# Patient Record
Sex: Female | Born: 1960 | Race: Black or African American | Hispanic: No | Marital: Married | Smoking: Never smoker
Health system: Southern US, Community
[De-identification: ages and names within clinical notes are randomized; demographics above are authoritative.]

## PROBLEM LIST (undated history)

## (undated) DIAGNOSIS — C50919 Malignant neoplasm of unspecified site of unspecified female breast: Secondary | ICD-10-CM

## (undated) DIAGNOSIS — M1990 Unspecified osteoarthritis, unspecified site: Secondary | ICD-10-CM

## (undated) DIAGNOSIS — R7303 Prediabetes: Secondary | ICD-10-CM

## (undated) DIAGNOSIS — I10 Essential (primary) hypertension: Secondary | ICD-10-CM

## (undated) DIAGNOSIS — D573 Sickle-cell trait: Secondary | ICD-10-CM

## (undated) DIAGNOSIS — Z808 Family history of malignant neoplasm of other organs or systems: Secondary | ICD-10-CM

## (undated) DIAGNOSIS — Z806 Family history of leukemia: Secondary | ICD-10-CM

## (undated) DIAGNOSIS — D649 Anemia, unspecified: Secondary | ICD-10-CM

## (undated) DIAGNOSIS — Z803 Family history of malignant neoplasm of breast: Secondary | ICD-10-CM

## (undated) HISTORY — PX: WISDOM TOOTH EXTRACTION: SHX21

## (undated) HISTORY — DX: Family history of malignant neoplasm of other organs or systems: Z80.8

## (undated) HISTORY — PX: DILATION AND CURETTAGE, DIAGNOSTIC / THERAPEUTIC: SUR384

## (undated) HISTORY — DX: Family history of leukemia: Z80.6

## (undated) HISTORY — PX: HERNIA REPAIR: SHX51

## (undated) HISTORY — DX: Family history of malignant neoplasm of breast: Z80.3

## (undated) HISTORY — PX: COLONOSCOPY: SHX174

---

## 2018-04-24 ENCOUNTER — Other Ambulatory Visit: Payer: Self-pay | Admitting: General Surgery

## 2018-04-24 DIAGNOSIS — C50411 Malignant neoplasm of upper-outer quadrant of right female breast: Secondary | ICD-10-CM

## 2018-04-24 DIAGNOSIS — Z170 Estrogen receptor positive status [ER+]: Principal | ICD-10-CM

## 2018-04-24 NOTE — Progress Notes (Signed)
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  Mammogram 03/16/2018: Negative left breast, asymmetry in the upper outer right breast.  Diagnostic imaging confirmed a 9 mm mass at 9:30 on the right breast.  Diagnostic Mammogram 04/02/2018: The area of asymmetric nodular density within the slightly superior lateral aspect of the right breast in an anterior third/retroareolar location persists with compression.  The borders are slightly irregular and otherwise obscured.  Histology per Pathology Report:  Right Breast 04/09/2018   Receptor Status: ER(+), PR (-), Her2-neu (-), Ki-67(40%)   Past/Anticipated interventions by surgeon, if any: Dr. Barry Dienes  - Ordered MM RT radioactive seed loc, sentinel node Inj. - She appears to be a good candidate for breast conservation, although she needs genetic testing prior to surgery.  - If negative, will plan seed localized lumpectomy with SLN bx. - If positive, will refer to plastic surgery to discuss bilateral mastectomy with reconstruction. - Lumpectomy would be followed by radiation, probable oncotype, and antiestrogen therapy. -I have ordered urgent genetics consult.  I am also referring her for oncology and radiation consults in [place] due to her location in [place]. -No conclusive treatment plan.  Past/Anticipated interventions by medical oncology, if any: Chemotherapy  Appointment scheduled with Dr. Lindi Adie 04/28/2018 4 pm. Lymphedema issues, if any:    Pain issues, if any:  No  SAFETY ISSUES:  Prior radiation? No  Pacemaker/ICD? No  Possible current pregnancy? No  Is the patient on methotrexate? No  Current Complaints / other details:  Mother, maternal aunt, maternal first cousin had breast cancer.  -She has sickle  cell trait.      Cori Razor, RN 04/24/2018,12:31 PM

## 2018-04-27 ENCOUNTER — Telehealth: Payer: Self-pay | Admitting: Hematology and Oncology

## 2018-04-27 NOTE — Telephone Encounter (Signed)
On 8/8 I received a new urgent referral from Dr. Barry Dienes for the pt to see med onc and genetics. Pt has been scheduled for genetics on 8/13 at 10am and to see Dr. Lindi Adie at 4pm on the same day. Pt agreed to the appts.

## 2018-04-28 ENCOUNTER — Inpatient Hospital Stay: Payer: BLUE CROSS/BLUE SHIELD

## 2018-04-28 ENCOUNTER — Encounter: Payer: Self-pay | Admitting: Radiation Oncology

## 2018-04-28 ENCOUNTER — Other Ambulatory Visit: Payer: Self-pay | Admitting: General Surgery

## 2018-04-28 ENCOUNTER — Ambulatory Visit
Admission: RE | Admit: 2018-04-28 | Discharge: 2018-04-28 | Disposition: A | Discharge status: Home / Self Care | Payer: BLUE CROSS/BLUE SHIELD | Source: Ambulatory Visit | Attending: Radiation Oncology | Admitting: Radiation Oncology

## 2018-04-28 ENCOUNTER — Encounter: Payer: Self-pay | Admitting: Licensed Clinical Social Worker

## 2018-04-28 ENCOUNTER — Other Ambulatory Visit: Payer: Self-pay

## 2018-04-28 ENCOUNTER — Inpatient Hospital Stay (HOSPITAL_BASED_OUTPATIENT_CLINIC_OR_DEPARTMENT_OTHER): Payer: BLUE CROSS/BLUE SHIELD | Admitting: Licensed Clinical Social Worker

## 2018-04-28 ENCOUNTER — Inpatient Hospital Stay: Payer: BLUE CROSS/BLUE SHIELD | Attending: Hematology and Oncology | Admitting: Hematology and Oncology

## 2018-04-28 VITALS — BP 133/51 | HR 61 | Temp 97.7°F | Resp 20 | Ht 69.0 in | Wt 237.0 lb

## 2018-04-28 DIAGNOSIS — C50411 Malignant neoplasm of upper-outer quadrant of right female breast: Secondary | ICD-10-CM | POA: Insufficient documentation

## 2018-04-28 DIAGNOSIS — Z170 Estrogen receptor positive status [ER+]: Secondary | ICD-10-CM | POA: Diagnosis not present

## 2018-04-28 DIAGNOSIS — Z808 Family history of malignant neoplasm of other organs or systems: Secondary | ICD-10-CM | POA: Insufficient documentation

## 2018-04-28 DIAGNOSIS — Z806 Family history of leukemia: Secondary | ICD-10-CM

## 2018-04-28 DIAGNOSIS — C50911 Malignant neoplasm of unspecified site of right female breast: Secondary | ICD-10-CM | POA: Diagnosis present

## 2018-04-28 DIAGNOSIS — Z803 Family history of malignant neoplasm of breast: Secondary | ICD-10-CM | POA: Diagnosis not present

## 2018-04-28 HISTORY — DX: Sickle-cell trait: D57.3

## 2018-04-28 NOTE — Assessment & Plan Note (Addendum)
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 After reviewing the pathology in detail, we proceeded to discuss the different treatment options between surgery, radiation, chemotherapy, antiestrogen therapies.  Recommendations: Genetic counseling 1. Breast conserving surgery followed by 2. Oncotype DX testing to determine if chemotherapy would be of any benefit followed by 3. Adjuvant radiation therapy followed by 4. Adjuvant antiestrogen therapy  Oncotype counseling: I discussed Oncotype DX test. I explained to the patient that this is a 21 gene panel to evaluate patient tumors DNA to calculate recurrence score. This would help determine whether patient has high risk or intermediate risk or low risk breast cancer. She understands that if her tumor was found to be high risk, she would benefit from systemic chemotherapy. If low risk, no need of chemotherapy. If she was found to be intermediate risk, we would need to evaluate the score as well as other risk factors and determine if an abbreviated chemotherapy may be of benefit.  Return to clinic after surgery to discuss final pathology report and then determine if Oncotype DX testing will need to be sent.

## 2018-04-28 NOTE — Progress Notes (Signed)
REFERRING PROVIDER: [name], MD [address] [place], [place] [address]  PRIMARY REASON FOR VISIT:  1. Malignant neoplasm of upper-outer quadrant of right breast in female, estrogen receptor positive ([place])   2. Family history of breast cancer   3. Family history of leukemia   4. Family history of bone cancer      HISTORY OF PRESENT ILLNESS:   Donna Gould, a 56 y.o. female, was seen for a [place] cancer genetics consultation at the request of Dr. Barry Dienes due to a personal and family history of cancer.  Donna Gould presents to clinic today to discuss the possibility of a hereditary predisposition to cancer, genetic testing, and to further clarify her future cancer risks, as well as potential cancer risks for family members.   In 2019, at the age of 77, Donna Gould was diagnosed with cancer of the right breast (IDC), ER+, PR-, HER-2-. She is waiting on genetics results to help with treatment decisions.   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  First live birth at age: no children. OCP use for approximately 30 years.  Ovaries intact: yes.  Hysterectomy: no.  Menopausal status: perimenopausal.  HRT use: 0 years. Colonoscopy: yes; normal. Mammogram within the last year: yes. Number of breast biopsies: 1.  Past Medical History:  Diagnosis Date  . Family history of bone cancer   . Family history of breast cancer   . Family history of leukemia      Social History   Socioeconomic History  . Marital status: Married    Spouse name: Not on file  . Number of children: Not on file  . Years of education: Not on file  . Highest  education level: Not on file  Occupational History  . Not on file  Social Needs  . Financial resource strain: Not on file  . Food insecurity:    Worry: Not on file    Inability: Not on file  . Transportation needs:    Medical: Not on file    Non-medical: Not on file  Tobacco Use  . Smoking status: Not on file  Substance and Sexual Activity  . Alcohol use: Not on file  . Drug use: Not on file  . Sexual activity: Not on file  Lifestyle  . Physical activity:    Days per week: Not on file    Minutes per session: Not on file  . Stress: Not on file  Relationships  . Social connections:    Talks on phone: Not on file    Gets together: Not on file    Attends religious service: Not on file    Active member of club or organization: Not on file    Attends meetings of clubs or organizations: Not on file    Relationship status: Not on file  Other Topics Concern  . Not on file  Social History Narrative  . Not on file     FAMILY HISTORY:  We obtained a detailed, 4-generation family history.  Significant diagnoses are listed below: Family History  Problem Relation Age of Onset  . Breast cancer Mother  dx 43s  . Bone cancer Father        d. 40  . Breast cancer Maternal Aunt   . Breast cancer Maternal Aunt   . Breast cancer Cousin 45       maternal cousin  . Leukemia Cousin        d. 77s   Donna Gould does not have children. She has three sisters, ages 56, 69, and 40. She has 5 nieces and 1 nephew. No cancer history.  Donna Gould father had bone cancer and died at 81. He had two brothers and three sisters. Donna Gould has 11 paternal cousins, no cancer history. She does not know ages of death or cancer history for her paternal grandparents, but knows they are both deceased.  Donna Gould mother was diagnosed with breast cancer in her 8's. She is living at 66. Donna Gould has 8 maternal aunts and uncles. One of her aunts had breast cancer, unknown age of diagnosis, and is living  in her 46's. Another maternal aunt had breast cancer, unknown age of diagnosis, and passed away in her 23's but not from the breast cancer. Donna Gould also has a maternal cousin, who is the daughter of one of her aunts without a cancer history, that recently had breast cancer at 58 and had a double mastectomy. She does not know if this cousin has had genetic testing. Her cousin is living at 78. Another cousin, the son of her aunt in her 87's, had leukemia and died in his 64s. Donna Gould does not know the ages of death or cancer history for her maternal grandparents, but knows they are both deceased.   Donna Gould is unaware of previous family history of genetic testing for hereditary cancer risks. Patient's maternal ancestors are of African American descent, and paternal ancestors are of African American descent. There is no reported Ashkenazi Jewish ancestry. There is no known consanguinity.  GENETIC COUNSELING ASSESSMENT: Donna Gould is a 57 y.o. female with a personal and family history which is somewhat suggestive of a Hereditary Cancer Predisposition Syndrome. We, therefore, discussed and recommended the following at today's visit.   DISCUSSION: We reviewed the characteristics, features and inheritance patterns of hereditary cancer syndromes. We also discussed genetic testing, including the appropriate family members to test, the process of testing, insurance coverage and turn-around-time for results. We discussed the implications of a negative, positive and/or variant of uncertain significant result. In order to get genetic test results in a timely manner so that Donna Gould can use these genetic test results for surgical decisions, we recommended Donna Gould pursue genetic testing for the [facility] Breast Cancer STAT Panel. If this test is negative, we then recommend Donna Gould pursue reflex genetic testing to the [facility] Common Hereditary Cancers gene panel.   The STAT Breast cancer panel offered by  [facility] includes sequencing and rearrangement analysis for the following 9 genes:  ATM, BRCA1, BRCA2, CDH1, CHEK2, PALB2, PTEN, STK11 and TP53.    The Common Hereditary Cancers Panel offered by [facility] includes sequencing and/or deletion duplication testing of the following 47 genes: APC, ATM, AXIN2, BARD1, BMPR1A, BRCA1, BRCA2, BRIP1, CDH1, CDKN2A (p14ARF), CDKN2A (p16INK4a), CKD4, CHEK2, CTNNA1, DICER1, EPCAM (Deletion/duplication testing only), GREM1 (promoter region deletion/duplication testing only), KIT, MEN1, MLH1, MSH2, MSH3, MSH6, MUTYH, NBN, NF1, NHTL1, PALB2, PDGFRA, PMS2, POLD1, POLE, PTEN, RAD50, RAD51C, RAD51D, SDHB, SDHC, SDHD, SMAD4, SMARCA4. STK11, TP53, TSC1, TSC2, and VHL.  The following genes were evaluated for sequence changes only: SDHA  and HOXB13 c.251G>A variant only.  We discussed that only 5-10% of cancers are associated with a Hereditary cancer predisposition syndrome.  One of the most common hereditary cancer syndromes that increases breast cancer risk is called Hereditary Breast and Ovarian Cancer (HBOC) syndrome.  This syndrome is caused by mutations in the BRCA1 and BRCA2 genes.  This syndrome increases an individual's lifetime risk to develop breast, ovarian, pancreatic, and other types of cancer.  There are also many other cancer predisposition syndromes caused by mutations in several other genes.  We discussed that if she is found to have a mutation in one of these genes, it may impact surgical decisions, and alter future medical management recommendations such as increased cancer screenings and consideration of risk reducing surgeries.  A positive result could also have implications for the patient's family members.  A Negative result would mean we were unable to identify a hereditary component to her cancer, but does not rule out the possibility of a hereditary basis for her cancer.  There could be mutations that are undetectable by current technology, or in genes not yet  tested or identified to increase cancer risk.    We discussed the potential to find a Variant of Uncertain Significance or VUS.  These are variants that have not yet been identified as pathogenic or benign, and it is unknown if this variant is associated with increased cancer risk or if this is a normal finding.  Most VUS's are reclassified to benign or likely benign.   It should not be used to make medical management decisions. With time, we suspect the lab will determine the significance of any VUS's identified if any.   Based on Ms. Korpela's personal and family history of cancer, she meets medical criteria for genetic testing. Despite that she meets criteria, she may still have an out of pocket cost. The lab will notify her of her out of pocket cost.    PLAN: After considering the risks, benefits, and limitations, Donna Gould  provided informed consent to pursue genetic testing and the blood sample was sent to [facility] for analysis of the Breast Cancer STAT Panel, reflex to Common Hereditary Cancers Panel. Results should be available within approximately 5-12 days' time, at which point they will be disclosed by telephone to Donna Gould, as will any additional recommendations warranted by these results. Ms. Morrissey will receive a summary of her genetic counseling visit and a copy of her results once available. This information will also be available in Epic. We encouraged Ms. Monsivais to remain in contact with cancer genetics annually so that we can continuously update the family history and inform her of any changes in cancer genetics and testing that may be of benefit for her family. Ms. Besser questions were answered to her satisfaction today. Our contact information was provided should additional questions or concerns arise.  Based on Ms. Anna's family history, we recommended her maternal cousin who was diagnosed with breast cancer at 1, have genetic counseling and testing. Ms. Mines will let us  know if we can be of any assistance in coordinating genetic counseling and/or testing for this family member.   Lastly, we encouraged Ms. Tuberville to remain in contact with cancer genetics annually so that we can continuously update the family history and inform her of any changes in cancer genetics and testing that may be of benefit for this family.   Ms.  Greeson questions were answered to her satisfaction today. Our contact information was provided  should additional questions or concerns arise. Thank you for the referral and allowing [place] to share in the care of your patient.   Epimenio Foot, MS Genetic Counselor [email or web address] Phone: [phone]  The patient was seen for a total of 40 minutes in face-to-face genetic counseling.  The patient was accompanied today by her husband, Darryl. This patient was discussed with Drs. Magrinat, Lindi Adie and/or Burr Medico who agrees with the above.

## 2018-04-28 NOTE — Progress Notes (Unsigned)
[facility] CONSULT NOTE  Patient Care Team: Patient, No Pcp Per as PCP - General (General Practice)  CHIEF COMPLAINTS/PURPOSE OF CONSULTATION:  Newly diagnosed breast cancer  HISTORY OF PRESENTING ILLNESS:  Donna Gould 57 y.o. female is here because of recent diagnosis of right breast cancer.  Patient had a routine screening mammogram that detected abnormality at 9:30 position measuring 9 mm in size.  Biopsy revealed invasive ductal carcinoma intermediate grade that is ER positive PR negative and HER-2 negative.  Ki-67 was 40%.  She was seen by Dr. Barry Dienes and patient was sent to [place] for discussion regarding treatment options.  She is seeing Dr. Lisbeth Renshaw with radiation oncology as well as genetics today.  I reviewed her records extensively and collaborated the history with the patient.  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 Family history of bone cancer   . Family history of breast cancer   . Family history of leukemia   . Sickle cell trait ([place])     SURGICAL HISTORY: No past surgical history on file.  SOCIAL HISTORY: Social History   Socioeconomic History  . Marital status: Married    Spouse name: Not on file  . Number of children: Not on file  . Years of education: Not on file  . Highest education level: Not on file  Occupational History  . Not on file  Social Needs  . Financial resource strain: Not on file  . Food insecurity:    Worry: Not on file    Inability: Not on file  . Transportation needs:    Medical: Not on file    Non-medical: Not on file  Tobacco Use  . Smoking status: Never  Smoker  . Smokeless tobacco: Never Used  Substance and Sexual Activity  . Alcohol use: Never    Frequency: Never  . Drug use: Never  . Sexual activity: Not on file  Lifestyle  . Physical activity:    Days per week: Not on file    Minutes per session: Not on file  . Stress: Not on file  Relationships  . Social connections:    Talks on phone: Not on file    Gets together: Not on file    Attends religious service: Not on file    Active member of club or organization: Not on file    Attends meetings of clubs or organizations: Not on file    Relationship status: Not on file  . Intimate partner violence:    Fear of current or ex partner: Not on file    Emotionally abused: Not on file    Physically abused: Not on file    Forced sexual activity: Not on file  Other Topics Concern  . Not on file  Social History Narrative   Unable to ask, husband present    FAMILY HISTORY: Family History  Problem Relation Age of Onset  . Breast cancer Mother        dx 93s  . Bone cancer Father        d. 38  . Breast cancer Maternal Aunt   . Breast cancer Maternal  Aunt   . Breast cancer Cousin 74       maternal cousin  . Leukemia Cousin        d. 37s    ALLERGIES:  has no allergies on file.  MEDICATIONS:  Current Outpatient Medications  Medication Sig Dispense Refill  . lisinopril-hydrochlorothiazide (PRINZIDE,ZESTORETIC) 20-25 MG tablet Take 1 tablet by mouth every morning.  3  . LO LOESTRIN FE 1 MG-10 MCG / 10 MCG tablet Take 1 tablet by mouth daily.  12   No current facility-administered medications for this visit.     REVIEW OF SYSTEMS:   Constitutional: Denies fevers, chills or abnormal night sweats Eyes: Denies blurriness of vision, double vision or watery eyes Ears, nose, mouth, throat, and face: Denies mucositis or sore throat Respiratory: Denies cough, dyspnea or wheezes Cardiovascular: Denies palpitation, chest discomfort or lower extremity swelling Gastrointestinal:   Denies nausea, heartburn or change in bowel habits Skin: Denies abnormal skin rashes Lymphatics: Denies new lymphadenopathy or easy bruising Neurological:Denies numbness, tingling or new weaknesses Behavioral/Psych: Mood is stable, no new changes  Breast:  Denies any palpable lumps or discharge All other systems were reviewed with the patient and are negative.  PHYSICAL EXAMINATION: ECOG PERFORMANCE STATUS: 1 - Symptomatic but completely ambulatory  There were no vitals filed for this visit. There were no vitals filed for this visit.  GENERAL:alert, no distress and comfortable SKIN: skin color, texture, turgor are normal, no rashes or significant lesions EYES: normal, conjunctiva are pink and non-injected, sclera clear OROPHARYNX:no exudate, no erythema and lips, buccal mucosa, and tongue normal  NECK: supple, thyroid normal size, non-tender, without nodularity LYMPH:  no palpable lymphadenopathy in the cervical, axillary or inguinal LUNGS: clear to auscultation and percussion with normal breathing effort HEART: regular rate & rhythm and no murmurs and no lower extremity edema ABDOMEN:abdomen soft, non-tender and normal bowel sounds Musculoskeletal:no cyanosis of digits and no clubbing  PSYCH: alert & oriented x 3 with fluent speech NEURO: no focal motor/sensory deficits BREAST: No palpable nodules in breast. No palpable axillary or supraclavicular lymphadenopathy (exam performed in the presence of a chaperone)   RADIOGRAPHIC STUDIES: I have personally reviewed the radiological reports and agreed with the findings in the report.  ASSESSMENT AND PLAN:  Malignant neoplasm of upper-outer quadrant of right breast in female, estrogen receptor positive ([place]) 04/08/2018:Screening detected right breast mass at 9:30 position measuring 9 mm, biopsy revealed IDC intermediate grade, ER 50%, PR negative HER-2 negative, Ki-67 40%, T1 be N0 stage Ia  Pathology and radiology counseling:Discussed  with the patient, the details of pathology including the type of breast cancer,the clinical staging, the significance of ER, PR and HER-2/neu receptors and the implications for treatment. After reviewing the pathology in detail, we proceeded to discuss the different treatment options between surgery, radiation, chemotherapy, antiestrogen therapies.  Recommendations: Genetic counseling 1. Breast conserving surgery followed by 2. Oncotype DX testing to determine if chemotherapy would be of any benefit followed by 3. Adjuvant radiation therapy followed by 4. Adjuvant antiestrogen therapy  Oncotype counseling: I discussed Oncotype DX test. I explained to the patient that this is a 21 gene panel to evaluate patient tumors DNA to calculate recurrence score. This would help determine whether patient has high risk or intermediate risk or low risk breast cancer. She understands that if her tumor was found to be high risk, she would benefit from systemic chemotherapy. If low risk, no need of chemotherapy. If she was found to be intermediate risk, we would  need to evaluate the score as well as other risk factors and determine if an abbreviated chemotherapy may be of benefit.  Return to clinic after surgery to discuss final pathology report and then determine if Oncotype DX testing will need to be sent.   All questions were answered. The patient knows to call the clinic with any problems, questions or concerns.    Harriette Ohara, MD 04/28/18

## 2018-04-29 ENCOUNTER — Encounter: Payer: Self-pay | Admitting: *Deleted

## 2018-04-29 NOTE — Progress Notes (Signed)
Radiation Oncology         [phone] ________________________________  Name: Donna Gould        MRN: [id number]  Date of Service: 04/28/2018 DOB: December 11, 1960  [id number], No Pcp Per  [name], MD     REFERRING PHYSICIAN: [name], MD   DIAGNOSIS: The encounter diagnosis was Malignant neoplasm of upper-outer quadrant of right breast in female, estrogen receptor positive ([place]).   HISTORY OF PRESENT ILLNESS: Donna Gould is a 57 y.o. female seen at the request of Dr. Barry Dienes for a new diagnosis of right breast cancer. The patient was noted to have a screening detected mass in the right breast. 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 She lives in [place] and elects for her medical oncology and surgical oncology care in [place]. She comes today to discuss radiotherapy as a portion of possible treatment for her cancer. She has met this am with genetics and this information from this assessment will determine her surgical decision making, though she has tentative plans for surgery at the end of the month.   PREVIOUS RADIATION THERAPY: No   PAST MEDICAL HISTORY:  Past Medical History:  Diagnosis Date  . Family history of bone cancer   . Family history of breast cancer   . Family history of leukemia   . Sickle cell trait (HCC)        PAST SURGICAL HISTORY:History reviewed. No pertinent surgical history.   FAMILY HISTORY:  Family History  Problem Relation Age of Onset  . Breast cancer Mother        dx 61s  . Bone cancer Father        d. 91  . Breast cancer Maternal Aunt   . Breast cancer Maternal Aunt   . Breast cancer Cousin 72       maternal cousin  . Leukemia Cousin        d. 51s     SOCIAL HISTORY:  reports that she has never smoked. She has never used smokeless tobacco. She reports that she does not drink alcohol or use  drugs.   ALLERGIES: Patient has no allergy information on record.   MEDICATIONS:  Current Outpatient Medications  Medication Sig Dispense Refill  . lisinopril-hydrochlorothiazide (PRINZIDE,ZESTORETIC) 20-25 MG tablet Take 1 tablet by mouth every morning.  3  . LO LOESTRIN FE 1 MG-10 MCG / 10 MCG tablet Take 1 tablet by mouth daily.  12   No current facility-administered medications for this encounter.      REVIEW OF SYSTEMS: On review of systems, the patient reports that she is doing well overall. She denies any chest pain, shortness of breath, cough, fevers, chills, night sweats, unintended weight changes. She denies any bowel or bladder disturbances, and denies abdominal pain, nausea or vomiting. She denies any new musculoskeletal or joint aches or pains. A complete review of systems is obtained and is otherwise negative.     PHYSICAL EXAM:  Wt Readings from Last 3 Encounters:  04/28/18 237 lb (107.5 kg)   Temp Readings from Last 3 Encounters:  04/28/18 97.7 F (36.5 C) (Oral)   BP Readings from Last 3 Encounters:  04/28/18 (!) 133/51   Pulse Readings from Last 3 Encounters:  04/28/18 61     In general this is a well appearing African American female in  no acute distress. She is alert and oriented x4 and appropriate throughout the examination. HEENT reveals that the patient is normocephalic, atraumatic. EOMs are intact. Cardiopulmonary assessment is negative for acute distress and she exhibits normal effort.    ECOG = 0  0 - Asymptomatic (Fully active, able to carry on all predisease activities without restriction)  1 - Symptomatic but completely ambulatory (Restricted in physically strenuous activity but ambulatory and able to carry out work of a light or sedentary nature. For example, light housework, office work)  2 - Symptomatic, <50% in bed during the day (Ambulatory and capable of all self care but unable to carry out any work activities. Up and about more than  50% of waking hours)  3 - Symptomatic, >50% in bed, but not bedbound (Capable of only limited self-care, confined to bed or chair 50% or more of waking hours)  4 - Bedbound (Completely disabled. Cannot carry on any self-care. Totally confined to bed or chair)  5 - Death   Eustace Pen MM, Creech RH, Tormey DC, et al. [phone]). "Toxicity and response criteria of the [facility]". [place] Oncol. 5 (6): 649-55    LABORATORY DATA:  No results found for: WBC, HGB, HCT, MCV, PLT No results found for: NA, K, CL, CO2 No results found for: ALT, AST, GGT, ALKPHOS, BILITOT    RADIOGRAPHY: No results found.     IMPRESSION/PLAN: 1. 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 Dr. Lisbeth Renshaw discusses the pathology findings and reviews the nature of right breast disease. She is awaiting genetic results to make surgical decisions. Dr. Lisbeth Renshaw discusses the rationale for adjuvant radiotherapy following breast conservation therapy. With the data we have today, if she did undergo mastectomy, she would not likely benefit from radiotherapy, however we did discuss that final pathology assessment would determine this if she has mastectomy. Depending on the size of the final tumor measurements rendered by pathology, the tumor may be tested for Oncotype Dx score to determine a role for systemic therapy. Provided that chemotherapy is not indicated, the patient's course would then be followed by external radiotherapy to the breast followed by antiestrogen therapy. We discussed the risks, benefits, short, and long term effects of radiotherapy. Dr. Lisbeth Renshaw discusses the delivery and logistics of radiotherapy and anticipates a course of 6 1/2 weeks of radiotherapy if she has conservation surgery. We will follow up with her decision making. She may also elect to have radiotherapy closer to home in [place] or in [place], [place].  In a visit lasting 60 minutes, greater than 50%  of the time was spent face to face discussing her case, and coordinating the patient's care.   The above documentation reflects my direct findings during this shared patient visit. Please see the separate note by Dr. Lisbeth Renshaw on this date for the remainder of the patient's plan of care.    Carola Rhine, PAC

## 2018-04-30 ENCOUNTER — Telehealth: Payer: Self-pay | Admitting: Hematology and Oncology

## 2018-04-30 NOTE — Telephone Encounter (Signed)
Per 8/14 sch msg - make f/u appt for 9/4.Marland Kitchen  Called patient and left message regarding 9/4 appt @ 10:00 am.  Mailed calendar.

## 2018-05-06 ENCOUNTER — Other Ambulatory Visit: Payer: Self-pay

## 2018-05-06 ENCOUNTER — Encounter: Payer: Self-pay | Admitting: Licensed Clinical Social Worker

## 2018-05-06 ENCOUNTER — Ambulatory Visit: Payer: Self-pay | Admitting: Licensed Clinical Social Worker

## 2018-05-06 ENCOUNTER — Encounter (HOSPITAL_BASED_OUTPATIENT_CLINIC_OR_DEPARTMENT_OTHER): Payer: Self-pay | Admitting: *Deleted

## 2018-05-06 ENCOUNTER — Telehealth: Payer: Self-pay | Admitting: Licensed Clinical Social Worker

## 2018-05-06 DIAGNOSIS — C50411 Malignant neoplasm of upper-outer quadrant of right female breast: Secondary | ICD-10-CM

## 2018-05-06 DIAGNOSIS — Z806 Family history of leukemia: Secondary | ICD-10-CM

## 2018-05-06 DIAGNOSIS — Z1379 Encounter for other screening for genetic and chromosomal anomalies: Secondary | ICD-10-CM

## 2018-05-06 DIAGNOSIS — Z803 Family history of malignant neoplasm of breast: Secondary | ICD-10-CM

## 2018-05-06 DIAGNOSIS — Z170 Estrogen receptor positive status [ER+]: Secondary | ICD-10-CM

## 2018-05-06 DIAGNOSIS — Z808 Family history of malignant neoplasm of other organs or systems: Secondary | ICD-10-CM

## 2018-05-06 NOTE — Telephone Encounter (Signed)
Revealed negative genetic testing.     This normal result is reassuring. We discussed that we do not know why she has breast cancer or why there is cancer in the family. It could be due to a different gene that we are not testing, or something our current technology cannot pick up.  It will be important for her to keep in contact with genetics to learn if additional testing may be needed in the future.

## 2018-05-06 NOTE — Progress Notes (Signed)
HPI:  Donna Gould was previously seen in the [place] clinic on 04/28/2018 due to a personal and family history of breast cancer and concerns regarding a hereditary predisposition to cancer. Please refer to our prior cancer genetics clinic note for more information regarding Donna Gould's medical, social and family histories, and our assessment and recommendations, at the time. Donna Gould recent genetic test results were disclosed to her, as well as recommendations warranted by these results. These results and recommendations are discussed in more detail below.  CANCER HISTORY:    Malignant neoplasm of upper-outer quadrant of right breast in female, estrogen receptor positive ([place])   04/08/2018 Initial Diagnosis    Screening detected right breast mass at 9:30 position measuring 9 mm, biopsy revealed IDC intermediate grade, ER 50%, PR negative HER-2 negative, Ki-67 40%, T1 be N0 stage Ia     Genetic Testing    Negative genetic testing on the [facility] Breast Cancer STAT + Common Hereditary Cancers Panel. The STAT Breast cancer panel offered by [facility] includes sequencing and rearrangement analysis for the following 9 genes:  ATM, BRCA1, BRCA2, CDH1, CHEK2, PALB2, PTEN, STK11 and TP53.  The Common Hereditary Cancers Panel offered by [facility] includes sequencing and/or deletion duplication testing of the following 47 genes: APC, ATM, AXIN2, BARD1, BMPR1A, BRCA1, BRCA2, BRIP1, CDH1, CDKN2A (p14ARF), CDKN2A (p16INK4a), CKD4, CHEK2, CTNNA1, DICER1, EPCAM (Deletion/duplication testing only), GREM1 (promoter region deletion/duplication testing only), KIT, MEN1, MLH1, MSH2, MSH3, MSH6, MUTYH, NBN, NF1, NHTL1, PALB2, PDGFRA, PMS2, POLD1, POLE, PTEN, RAD50, RAD51C, RAD51D, SDHB, SDHC, SDHD, SMAD4, SMARCA4. STK11, TP53, TSC1, TSC2, and VHL.  The following genes were evaluated for sequence changes only: SDHA and HOXB13 c.251G>A variant only.  The report date is 05/06/18      FAMILY HISTORY:  We obtained  a detailed, 4-generation family history.  Significant diagnoses are listed below: Family History  Problem Relation Age of Onset  . Breast cancer Mother        dx 80s  . Bone cancer Father        d. 77  . Breast cancer Maternal Aunt   . Breast cancer Maternal Aunt   . Breast cancer Cousin 37       maternal cousin  . Leukemia Cousin        d. 74s   Donna Gould does not have children. She has three sisters, ages 51, 73, and 41. She has 5 nieces and 1 nephew. No cancer history.  Donna Gould father had bone cancer and died at 14. He had two brothers and three sisters. Donna Gould has 11 paternal cousins, no cancer history. She does not know ages of death or cancer history for her paternal grandparents, but knows they are both deceased.  Donna Gould mother was diagnosed with breast cancer in her 54's. She is living at 50. Donna Gould has 8 maternal aunts and uncles. One of her aunts had breast cancer, unknown age of diagnosis, and is living in her 96's. Another maternal aunt had breast cancer, unknown age of diagnosis, and passed away in her 49's but not from the breast cancer. Donna Gould also has a maternal cousin, who is the daughter of one of her aunts without a cancer history, that recently had breast cancer at 75 and had a double mastectomy. She does not know if this cousin has had genetic testing. Her cousin is living at 22. Another cousin, the son of her aunt in her 40's, had leukemia and died in his  72s. Donna Gould does not know the ages of death or cancer history for her maternal grandparents, but knows they are both deceased.   Donna Gould is unaware of previous family history of genetic testing for hereditary cancer risks. Patient's maternal ancestors are of African American descent, and paternal ancestors are of African American descent. There is no reported Ashkenazi Jewish ancestry. There is no known consanguinity.   GENETIC TEST RESULTS: Genetic testing performed through [facility]'s Breast  Cancer STAT + Common Hereditary Cancers Panel reported out on 05/06/2018 showed no pathogenic mutations. The STAT Breast cancer panel offered by [facility] includes sequencing and rearrangement analysis for the following 9 genes:  ATM, BRCA1, BRCA2, CDH1, CHEK2, PALB2, PTEN, STK11 and TP53.  The Common Hereditary Cancers Panel offered by [facility] includes sequencing and/or deletion duplication testing of the following 47 genes: APC, ATM, AXIN2, BARD1, BMPR1A, BRCA1, BRCA2, BRIP1, CDH1, CDKN2A (p14ARF), CDKN2A (p16INK4a), CKD4, CHEK2, CTNNA1, DICER1, EPCAM (Deletion/duplication testing only), GREM1 (promoter region deletion/duplication testing only), KIT, MEN1, MLH1, MSH2, MSH3, MSH6, MUTYH, NBN, NF1, NHTL1, PALB2, PDGFRA, PMS2, POLD1, POLE, PTEN, RAD50, RAD51C, RAD51D, SDHB, SDHC, SDHD, SMAD4, SMARCA4. STK11, TP53, TSC1, TSC2, and VHL.  The following genes were evaluated for sequence changes only: SDHA and HOXB13 c.251G>A variant only.  The test report will be scanned into EPIC and will be located under the Molecular Pathology section of the Results Review tab. A portion of the result report is included below for reference.     We discussed with Donna Gould that because current genetic testing is not perfect, it is possible there may be a gene mutation in one of these genes that current testing cannot detect, but that chance is small.  We also discussed, that there could be another gene that has not yet been discovered, or that we have not yet tested, that is responsible for the cancer diagnoses in the family. It is also possible there is a hereditary cause for the cancer in the family that Donna Gould did not inherit and therefore was not identified in her testing.  Therefore, it is important to remain in touch with cancer genetics in the future so that we can continue to offer Donna Gould the most up to date genetic testing.   CANCER SCREENING RECOMMENDATIONS: Donna Gould test result is considered negative (normal).   This means that we have not identified a hereditary cause for her personal and family history of cancer at this time.   While reassuring, this does not definitively rule out a hereditary predisposition to cancer. It is still possible that there could be genetic mutations that are undetectable by current technology, or genetic mutations in genes that have not been tested or identified to increase cancer risk.  Therefore, it is recommended she continue to follow the cancer management and screening guidelines provided by her oncology and primary healthcare provider. An individual's cancer risk is not determined by genetic test results alone.  Overall cancer risk assessment includes additional factors Gould as personal medical history, family history, etc.  These should be used to make a personalized plan for cancer prevention and surveillance.    RECOMMENDATIONS FOR FAMILY MEMBERS:  Relatives in this family might be at some increased risk of developing cancer, over the general population risk, simply due to the family history of cancer.  We recommended women in this family have a yearly mammogram beginning at age 5, or 67 years younger than the earliest onset of cancer, an annual clinical breast exam, and perform  monthly breast self-exams. Women in this family should also have a gynecological exam as recommended by their primary provider. All family members should have a colonoscopy by age 77 (or as directed by their doctors).  All family members should inform their physicians about the family history of cancer so their doctors can make the most appropriate screening recommendations for them.   It is also possible there is a hereditary cause for the cancer in Ms. Patin's family that she did not inherit and therefore was not identified in her.   We recommended her maternal cousin diagnosed with breast cancer at 35 have genetic counseling and testing. Donna Gould will let us know if we can be of any assistance in  coordinating genetic counseling and/or testing for these family members.   FOLLOW-UP: Lastly, we discussed with Donna Gould that cancer genetics is a rapidly advancing field and it is possible that new genetic tests will be appropriate for her and/or her family members in the future. We encouraged her to remain in contact with cancer genetics on an annual basis so we can update her personal and family histories and let her know of advances in cancer genetics that may benefit this family.   Our contact number was provided. Donna Gould questions were answered to her satisfaction, and she knows she is welcome to call us at anytime with additional questions or concerns.   Epimenio Foot, MS Genetic Counselor [email or web address] Phone: [phone]

## 2018-05-12 ENCOUNTER — Other Ambulatory Visit: Payer: Self-pay | Admitting: General Surgery

## 2018-05-12 ENCOUNTER — Encounter (HOSPITAL_BASED_OUTPATIENT_CLINIC_OR_DEPARTMENT_OTHER)
Admission: RE | Admit: 2018-05-12 | Discharge: 2018-05-12 | Disposition: A | Discharge status: Home / Self Care | Payer: BLUE CROSS/BLUE SHIELD | Source: Ambulatory Visit | Attending: General Surgery | Admitting: General Surgery

## 2018-05-12 ENCOUNTER — Ambulatory Visit
Admission: RE | Admit: 2018-05-12 | Discharge: 2018-05-12 | Disposition: A | Discharge status: Home / Self Care | Payer: BLUE CROSS/BLUE SHIELD | Source: Ambulatory Visit | Attending: General Surgery | Admitting: General Surgery

## 2018-05-12 DIAGNOSIS — Z170 Estrogen receptor positive status [ER+]: Principal | ICD-10-CM

## 2018-05-12 DIAGNOSIS — I10 Essential (primary) hypertension: Secondary | ICD-10-CM | POA: Diagnosis present

## 2018-05-12 DIAGNOSIS — C50411 Malignant neoplasm of upper-outer quadrant of right female breast: Secondary | ICD-10-CM

## 2018-05-12 DIAGNOSIS — R9431 Abnormal electrocardiogram [ECG] [EKG]: Secondary | ICD-10-CM | POA: Insufficient documentation

## 2018-05-12 LAB — COMPREHENSIVE METABOLIC PANEL
ALBUMIN: 3.1 g/dL — AB (ref 3.5–5.0)
ALT: 16 U/L (ref 0–44)
ANION GAP: 10 (ref 5–15)
AST: 20 U/L (ref 15–41)
Alkaline Phosphatase: 45 U/L (ref 38–126)
BILIRUBIN TOTAL: 1 mg/dL (ref 0.3–1.2)
BUN: 10 mg/dL (ref 6–20)
CALCIUM: 8.8 mg/dL — AB (ref 8.9–10.3)
CO2: 27 mmol/L (ref 22–32)
Chloride: 102 mmol/L (ref 98–111)
Creatinine, Ser: 1.17 mg/dL — ABNORMAL HIGH (ref 0.44–1.00)
GFR calc Af Amer: >60 mL/min (ref >60)
GFR, EST NON AFRICAN AMERICAN: 52 mL/min — AB (ref >60)
GLUCOSE: 122 mg/dL — AB (ref 70–99)
Potassium: 3.7 mmol/L (ref 3.5–5.1)
Sodium: 139 mmol/L (ref 135–145)
TOTAL PROTEIN: 7.8 g/dL (ref 6.5–8.1)

## 2018-05-12 LAB — CBC WITH DIFFERENTIAL/PLATELET
Abs Immature Granulocytes: 0 10*3/uL (ref 0.0–0.1)
Basophils Absolute: 0 10*3/uL (ref 0.0–0.1)
Basophils Relative: 1 %
EOS ABS: 0.1 10*3/uL (ref 0.0–0.7)
EOS PCT: 1 %
HEMATOCRIT: 38.4 % (ref 36.0–46.0)
Hemoglobin: 12.5 g/dL (ref 12.0–15.0)
Immature Granulocytes: 0 %
LYMPHS ABS: 1.7 10*3/uL (ref 0.7–4.0)
LYMPHS PCT: 39 %
MCH: 26.3 pg (ref 26.0–34.0)
MCHC: 32.6 g/dL (ref 30.0–36.0)
MCV: 80.8 fL (ref 78.0–100.0)
MONO ABS: 0.5 10*3/uL (ref 0.1–1.0)
MONOS PCT: 11 %
Neutro Abs: 2.1 10*3/uL (ref 1.7–7.7)
Neutrophils Relative %: 48 %
Platelets: 297 10*3/uL (ref 150–400)
RBC: 4.75 MIL/uL (ref 3.87–5.11)
RDW: 14.5 % (ref 11.5–15.5)
WBC: 4.4 10*3/uL (ref 4.0–10.5)

## 2018-05-12 NOTE — Progress Notes (Signed)
Ensure pre-surgical drink and pre-surgical soap given.  Instructed to finish drink by 0830.  Patient verbalizes understanding.

## 2018-05-12 NOTE — H&P (View-Only) (Signed)
Donna Gould Documented: 04/22/2018 2:44 PM Location: [facility] Patient #: [id number] DOB: [birth year] Married / Language: English / Race: Black or African American Female   History of Present Illness Donna Klein MD; 04/22/2018 3:59 PM) The patient is a 57 year old female who presents with breast cancer. Pt is a 57 yo F who is referred for consultation by Dr. Enid Skeens for a new diagnosis of right breast cancer. The patient presented with a screening detected right breast mass. Diagnostic imaging confirmed a 9 mm mass at 9:30 on the right breast. Core needle biopsy was performed and showed invasive ductal carcinoma, intermediate grade, ER +, PR neg, Her 2 negative, Ki 67 40%. Her mother had breast cancer, as well as her maternal aunt and maternal first cousin. ONe of these relatives was younger than age 62. She is premenopausal. She is a G2P0, menarche at age 27. She has some oral contraceptive use history.    Past Surgical History [name], [name]; 04/22/2018 2:44 PM) Breast Biopsy  Right.  Diagnostic Studies History [name], [name]; 04/22/2018 2:44 PM) Colonoscopy  1-5 years ago Mammogram  within last year Pap Smear  1-5 years ago  Allergies [name], [place]; 04/22/2018 2:48 PM) Allergies Reconciled   Medication History [name], CMA; 04/22/2018 2:48 PM) Lisinopril-Hydrochlorothiazide (20-25MG  Tablet, Oral) Active. Lo Loestrin Fe (1 MG-10 MCG /10 MCG Tablet, Oral) Active.  Social History [name], [name]; 04/22/2018 2:44 PM) Alcohol use  Occasional alcohol use. Caffeine use  Carbonated beverages, Coffee, Tea. No drug use  Tobacco use  Never smoker.  Family History [name], [name]; 04/22/2018 2:44 PM) Alcohol Abuse  Family Members In General. Arthritis  Mother. Breast Cancer  Family Members In General, Mother. Hypertension  Mother, Sister.  Pregnancy / Birth History [name], [name]; 04/22/2018 2:44 PM) Age at menarche   18 years. Contraceptive History  Oral contraceptives. Gravida  2 Irregular periods  Maternal age  53-40 Para  0  Other Problems [name], [name]; 04/22/2018 2:44 PM) Anxiety Disorder  Breast Cancer  Lump In Breast     Review of Systems [name] CMA; 04/22/2018 2:44 PM) General Not Present- Appetite Loss, Chills, Fatigue, Fever, Night Sweats, Weight Gain and Weight Loss. Skin Not Present- Change in Wart/Mole, Dryness, Hives, Jaundice, New Lesions, Non-Healing Wounds, Rash and Ulcer. HEENT Present- Yellow Eyes. Not Present- Earache, Hearing Loss, Hoarseness, Nose Bleed, Oral Ulcers, Ringing in the Ears, Seasonal Allergies, Sinus Pain, Sore Throat, Visual Disturbances and Wears glasses/contact lenses. Breast Present- Breast Mass. Not Present- Breast Pain, Nipple Discharge and Skin Changes. Female Genitourinary Not Present- Frequency, Nocturia, Painful Urination, Pelvic Pain and Urgency. Musculoskeletal Not Present- Back Pain, Joint Pain, Joint Stiffness, Muscle Pain, Muscle Weakness and Swelling of Extremities. Neurological Not Present- Decreased Memory, Fainting, Headaches, Numbness, Seizures, Tingling, Tremor, Trouble walking and Weakness. Psychiatric Not Present- Anxiety, Bipolar, Change in Sleep Pattern, Depression, Fearful and Frequent crying. Endocrine Not Present- Cold Intolerance, Excessive Hunger, Hair Changes, Heat Intolerance, Hot flashes and New Diabetes. Hematology Not Present- Blood Thinners, Easy Bruising, Excessive bleeding, Gland problems, HIV and Persistent Infections.  Vitals [name] CMA; 04/22/2018 2:48 PM) 04/22/2018 2:47 PM Weight: 238.2 lb Height: 69in Body Surface Area: 2.22 m Body Mass Index: 35.18 kg/m  Temp.: 97.53F  Pulse: 66 (Regular)  BP: 120/80 (Sitting, Left Arm, Standard)       Physical Exam Donna Klein MD; 04/22/2018 4:00 PM) General Mental Status-Alert. General Appearance-Consistent with stated  age. Hydration-Well hydrated. Voice-Normal.  Head  and Neck Head-normocephalic, atraumatic with no lesions or palpable masses. Trachea-midline. Thyroid Gland Characteristics - normal size and consistency.  Eye Eyeball - Bilateral-Extraocular movements intact. Sclera/Conjunctiva - Bilateral-No scleral icterus.  Chest and Lung Exam Chest and lung exam reveals -quiet, even and easy respiratory effort with no use of accessory muscles and on auscultation, normal breath sounds, no adventitious sounds and normal vocal resonance. Inspection Chest Wall - Normal. Back - normal.  Breast Note: breasts are relatively symmetric.; Right breast has some faint bruising at 9 o'clock. no palpable masses. heterogeneously dense. No nipple retraction or skin dimpling. no LAD   Cardiovascular Cardiovascular examination reveals -normal heart sounds, regular rate and rhythm with no murmurs and normal pedal pulses bilaterally.  Abdomen Inspection Inspection of the abdomen reveals - No Hernias. Palpation/Percussion Palpation and Percussion of the abdomen reveal - Soft, Non Tender, No Rebound tenderness, No Rigidity (guarding) and No hepatosplenomegaly. Auscultation Auscultation of the abdomen reveals - Bowel sounds normal.  Neurologic Neurologic evaluation reveals -alert and oriented x 3 with no impairment of recent or remote memory. Mental Status-Normal.  Musculoskeletal Global Assessment -Note: no gross deformities.  Normal Exam - Left-Upper Extremity Strength Normal and Lower Extremity Strength Normal. Normal Exam - Right-Upper Extremity Strength Normal and Lower Extremity Strength Normal.  Lymphatic Head & Neck  General Head & Neck Lymphatics: Bilateral - Description - Normal. Axillary  General Axillary Region: Bilateral - Description - Normal. Tenderness - Non Tender. Femoral & Inguinal  Generalized Femoral & Inguinal Lymphatics: Bilateral - Description - No  Generalized lymphadenopathy.    Assessment & Plan Donna Klein MD; 04/22/2018 4:03 PM) MALIGNANT NEOPLASM OF UPPER-OUTER QUADRANT OF RIGHT BREAST IN FEMALE, ESTROGEN RECEPTOR POSITIVE (C50.411) Impression: Pt has a new dx of right breast cancer, cT1bN0. She appears to be a good candidate for breast conservation, although she needs genetic testing prior to surgery. She has three family members with breast cancer. If negative, will plan seed localized lumpectomy wtih SLN bx. If positive, will refer to plastic surgery to discuss bilateral mastectomy with reconstruction. Lumpectomy would be followed by radiation, probable oncotype, and antiestrogen therapy.  I have ordered urgent genetics consult. I am also referring her for oncology and radiation consults in [place] due to her location in [place].  The surgical procedure was described to the patient. I discussed the incision type and location and that we would need radiology involved on with a wire or seed marker and/or sentinel node.  The risks and benefits of the procedure were described to the patient and she wishes to proceed.  We discussed the risks bleeding, infection, damage to other structures, need for further procedures/surgeries. We discussed the risk of seroma. The patient was advised if the area in the breast in cancer, we may need to go back to surgery for additional tissue to obtain negative margins or for a lymph node biopsy. The patient was advised that these are the most common complications, but that others can occur as well. They were advised against taking aspirin or other anti-inflammatory agents/blood thinners the week before surgery. Current Plans Referred to Genetic Counseling, for evaluation and follow up (Medical Genetics). STAT. Referred to Oncology, for evaluation and follow up (Oncology). Routine. Referred to Radiation Oncology, for evaluation and follow up (Radiation Oncology). Routine. Schedule for Surgery Pt  Education - [facility] breast cancer surgery: discussed with patient and provided information.   Signed by Donna Klein, MD (04/22/2018 4:04 PM)

## 2018-05-12 NOTE — H&P (Signed)
Donna Gould Documented: 04/22/2018 2:44 PM Location: [facility] Patient #: [id number] DOB: [birth year] Married / Language: English / Race: Black or African American Female   History of Present Illness Donna Klein MD; 04/22/2018 3:59 PM) The patient is a 57 year old female who presents with breast cancer. Pt is a 57 yo F who is referred for consultation by Dr. Enid Gould for a new diagnosis of right breast cancer. The patient presented with a screening detected right breast mass. Diagnostic imaging confirmed a 9 mm mass at 9:30 on the right breast. Core needle biopsy was performed and showed invasive ductal carcinoma, intermediate grade, ER +, PR neg, Her 2 negative, Ki 67 40%. Her mother had breast cancer, as well as her maternal aunt and maternal first cousin. ONe of these Gould was younger than age 85. She is premenopausal. She is a G2P0, menarche at age 42. She has some oral contraceptive use history.    Past Surgical History Donna Gould, [name]; 04/22/2018 2:44 PM) Breast Biopsy  Right.  Diagnostic Studies History Donna Gould, [name]; 04/22/2018 2:44 PM) Colonoscopy  1-5 years ago Mammogram  within last year Pap Smear  1-5 years ago  Allergies Donna Gould, [place]; 04/22/2018 2:48 PM) Allergies Reconciled   Medication History Donna Gould, CMA; 04/22/2018 2:48 PM) Lisinopril-Hydrochlorothiazide (20-25MG  Tablet, Oral) Active. Lo Loestrin Fe (1 MG-10 MCG /10 MCG Tablet, Oral) Active.  Social History Donna Gould, [name]; 04/22/2018 2:44 PM) Alcohol use  Occasional alcohol use. Caffeine use  Carbonated beverages, Coffee, Tea. No drug use  Tobacco use  Never smoker.  Family History Donna Gould, [name]; 04/22/2018 2:44 PM) Alcohol Abuse  Family Members In General. Arthritis  Mother. Breast Cancer  Family Members In General, Mother. Hypertension  Mother, Sister.  Pregnancy / Birth History Donna Gould, [name]; 04/22/2018 2:44 PM) Age at menarche   11 years. Contraceptive History  Oral contraceptives. Gravida  2 Irregular periods  Maternal age  3-40 Para  0  Other Problems Donna Gould, [name]; 04/22/2018 2:44 PM) Anxiety Disorder  Breast Cancer  Lump In Breast     Review of Systems Donna Gould CMA; 04/22/2018 2:44 PM) General Not Present- Appetite Loss, Chills, Fatigue, Fever, Night Sweats, Weight Gain and Weight Loss. Skin Not Present- Change in Wart/Mole, Dryness, Hives, Jaundice, New Lesions, Non-Healing Wounds, Rash and Ulcer. HEENT Present- Yellow Eyes. Not Present- Earache, Hearing Loss, Hoarseness, Nose Bleed, Oral Ulcers, Ringing in the Ears, Seasonal Allergies, Sinus Pain, Sore Throat, Visual Disturbances and Wears glasses/contact lenses. Breast Present- Breast Mass. Not Present- Breast Pain, Nipple Discharge and Skin Changes. Female Genitourinary Not Present- Frequency, Nocturia, Painful Urination, Pelvic Pain and Urgency. Musculoskeletal Not Present- Back Pain, Joint Pain, Joint Stiffness, Muscle Pain, Muscle Weakness and Swelling of Extremities. Neurological Not Present- Decreased Memory, Fainting, Headaches, Numbness, Seizures, Tingling, Tremor, Trouble walking and Weakness. Psychiatric Not Present- Anxiety, Bipolar, Change in Sleep Pattern, Depression, Fearful and Frequent crying. Endocrine Not Present- Cold Intolerance, Excessive Hunger, Hair Changes, Heat Intolerance, Hot flashes and New Diabetes. Hematology Not Present- Blood Thinners, Easy Bruising, Excessive bleeding, Gland problems, HIV and Persistent Infections.  Vitals Donna Gould CMA; 04/22/2018 2:48 PM) 04/22/2018 2:47 PM Weight: 238.2 lb Height: 69in Body Surface Area: 2.22 m Body Mass Index: 35.18 kg/m  Temp.: 97.41F  Pulse: 66 (Regular)  BP: 120/80 (Sitting, Left Arm, Standard)       Physical Exam Donna Klein MD; 04/22/2018 4:00 PM) General Mental Status-Alert. General Appearance-Consistent with stated  age. Hydration-Well hydrated. Voice-Normal.  Head  and Neck Head-normocephalic, atraumatic with no lesions or palpable masses. Trachea-midline. Thyroid Gland Characteristics - normal size and consistency.  Eye Eyeball - Bilateral-Extraocular movements intact. Sclera/Conjunctiva - Bilateral-No scleral icterus.  Chest and Lung Exam Chest and lung exam reveals -quiet, even and easy respiratory effort with no use of accessory muscles and on auscultation, normal breath sounds, no adventitious sounds and normal vocal resonance. Inspection Chest Wall - Normal. Back - normal.  Breast Note: breasts are relatively symmetric.; Right breast has some faint bruising at 9 o'clock. no palpable masses. heterogeneously dense. No nipple retraction or skin dimpling. no LAD   Cardiovascular Cardiovascular examination reveals -normal heart sounds, regular rate and rhythm with no murmurs and normal pedal pulses bilaterally.  Abdomen Inspection Inspection of the abdomen reveals - No Hernias. Palpation/Percussion Palpation and Percussion of the abdomen reveal - Soft, Non Tender, No Rebound tenderness, No Rigidity (guarding) and No hepatosplenomegaly. Auscultation Auscultation of the abdomen reveals - Bowel sounds normal.  Neurologic Neurologic evaluation reveals -alert and oriented x 3 with no impairment of recent or remote memory. Mental Status-Normal.  Musculoskeletal Global Assessment -Note: no gross deformities.  Normal Exam - Left-Upper Extremity Strength Normal and Lower Extremity Strength Normal. Normal Exam - Right-Upper Extremity Strength Normal and Lower Extremity Strength Normal.  Lymphatic Head & Neck  General Head & Neck Lymphatics: Bilateral - Description - Normal. Axillary  General Axillary Region: Bilateral - Description - Normal. Tenderness - Non Tender. Femoral & Inguinal  Generalized Femoral & Inguinal Lymphatics: Bilateral - Description - No  Generalized lymphadenopathy.    Assessment & Plan Donna Klein MD; 04/22/2018 4:03 PM) MALIGNANT NEOPLASM OF UPPER-OUTER QUADRANT OF RIGHT BREAST IN FEMALE, ESTROGEN RECEPTOR POSITIVE (C50.411) Impression: Pt has a new dx of right breast cancer, cT1bN0. She appears to be a good candidate for breast conservation, although she needs genetic testing prior to surgery. She has three family members with breast cancer. If negative, will plan seed localized lumpectomy wtih SLN bx. If positive, will refer to plastic surgery to discuss bilateral mastectomy with reconstruction. Lumpectomy would be followed by radiation, probable oncotype, and antiestrogen therapy.  I have ordered urgent genetics consult. I am also referring her for oncology and radiation consults in [place] due to her location in [place].  The surgical procedure was described to the patient. I discussed the incision type and location and that we would need radiology involved on with a wire or seed marker and/or sentinel node.  The risks and benefits of the procedure were described to the patient and she wishes to proceed.  We discussed the risks bleeding, infection, damage to other structures, need for further procedures/surgeries. We discussed the risk of seroma. The patient was advised if the area in the breast in cancer, we may need to go back to surgery for additional tissue to obtain negative margins or for a lymph node biopsy. The patient was advised that these are the most common complications, but that others can occur as well. They were advised against taking aspirin or other anti-inflammatory agents/blood thinners the week before surgery. Current Plans Referred to Genetic Counseling, for evaluation and follow up (Medical Genetics). STAT. Referred to Oncology, for evaluation and follow up (Oncology). Routine. Referred to Radiation Oncology, for evaluation and follow up (Radiation Oncology). Routine. Schedule for Surgery Pt  Education - [facility] breast cancer surgery: discussed with patient and provided information.   Signed by Donna Klein, MD (04/22/2018 4:04 PM)

## 2018-05-13 ENCOUNTER — Encounter (HOSPITAL_COMMUNITY)
Admission: RE | Admit: 2018-05-13 | Discharge: 2018-05-13 | Disposition: A | Discharge status: Home / Self Care | Payer: BLUE CROSS/BLUE SHIELD | Source: Ambulatory Visit | Attending: General Surgery | Admitting: General Surgery

## 2018-05-13 ENCOUNTER — Encounter (HOSPITAL_BASED_OUTPATIENT_CLINIC_OR_DEPARTMENT_OTHER)
Admission: RE | Disposition: A | Discharge status: Home / Self Care | Payer: Self-pay | Source: Ambulatory Visit | Attending: General Surgery

## 2018-05-13 ENCOUNTER — Encounter (HOSPITAL_BASED_OUTPATIENT_CLINIC_OR_DEPARTMENT_OTHER): Payer: Self-pay | Admitting: *Deleted

## 2018-05-13 ENCOUNTER — Other Ambulatory Visit: Payer: Self-pay

## 2018-05-13 ENCOUNTER — Ambulatory Visit (HOSPITAL_BASED_OUTPATIENT_CLINIC_OR_DEPARTMENT_OTHER): Payer: BLUE CROSS/BLUE SHIELD | Admitting: Anesthesiology

## 2018-05-13 ENCOUNTER — Ambulatory Visit
Admission: RE | Admit: 2018-05-13 | Discharge: 2018-05-13 | Disposition: A | Discharge status: Home / Self Care | Payer: 59 | Source: Ambulatory Visit | Attending: General Surgery | Admitting: General Surgery

## 2018-05-13 ENCOUNTER — Ambulatory Visit (HOSPITAL_BASED_OUTPATIENT_CLINIC_OR_DEPARTMENT_OTHER)
Admission: RE | Admit: 2018-05-13 | Discharge: 2018-05-13 | Disposition: A | Discharge status: Home / Self Care | Payer: BLUE CROSS/BLUE SHIELD | Source: Ambulatory Visit | Attending: General Surgery | Admitting: General Surgery

## 2018-05-13 DIAGNOSIS — I10 Essential (primary) hypertension: Secondary | ICD-10-CM | POA: Diagnosis not present

## 2018-05-13 DIAGNOSIS — C50411 Malignant neoplasm of upper-outer quadrant of right female breast: Secondary | ICD-10-CM | POA: Insufficient documentation

## 2018-05-13 DIAGNOSIS — D573 Sickle-cell trait: Secondary | ICD-10-CM | POA: Diagnosis not present

## 2018-05-13 DIAGNOSIS — Z793 Long term (current) use of hormonal contraceptives: Secondary | ICD-10-CM | POA: Insufficient documentation

## 2018-05-13 DIAGNOSIS — Z170 Estrogen receptor positive status [ER+]: Principal | ICD-10-CM

## 2018-05-13 DIAGNOSIS — Z79899 Other long term (current) drug therapy: Secondary | ICD-10-CM | POA: Insufficient documentation

## 2018-05-13 DIAGNOSIS — Z803 Family history of malignant neoplasm of breast: Secondary | ICD-10-CM | POA: Diagnosis not present

## 2018-05-13 HISTORY — DX: Essential (primary) hypertension: I10

## 2018-05-13 HISTORY — PX: BREAST LUMPECTOMY WITH RADIOACTIVE SEED AND SENTINEL LYMPH NODE BIOPSY: SHX6550

## 2018-05-13 HISTORY — DX: Unspecified osteoarthritis, unspecified site: M19.90

## 2018-05-13 HISTORY — DX: Anemia, unspecified: D64.9

## 2018-05-13 LAB — POCT PREGNANCY, URINE: PREG TEST UR: NEGATIVE

## 2018-05-13 SURGERY — BREAST LUMPECTOMY WITH RADIOACTIVE SEED AND SENTINEL LYMPH NODE BIOPSY
Anesthesia: General | Site: Breast | Laterality: Right

## 2018-05-13 MED ORDER — GLYCOPYRROLATE 0.2 MG/ML IJ SOLN
INTRAMUSCULAR | Status: DC | PRN
  Administered 2018-05-13 (×2): 0.1 mg via INTRAVENOUS

## 2018-05-13 MED ORDER — FENTANYL CITRATE (PF) 100 MCG/2ML IJ SOLN
50.0000 ug | INTRAMUSCULAR | Status: DC | PRN
  Administered 2018-05-13: 50 ug via INTRAVENOUS

## 2018-05-13 MED ORDER — DEXAMETHASONE SODIUM PHOSPHATE 10 MG/ML IJ SOLN
INTRAMUSCULAR | Status: DC | PRN
  Administered 2018-05-13: 10 mg via INTRAVENOUS

## 2018-05-13 MED ORDER — CELECOXIB 200 MG PO CAPS
ORAL_CAPSULE | ORAL | Status: AC
  Filled 2018-05-13: qty 1

## 2018-05-13 MED ORDER — OXYCODONE HCL 5 MG PO TABS: 5.0000 mg | ORAL_TABLET | Freq: Four times a day (QID) | ORAL | 0 refills | Status: DC | PRN

## 2018-05-13 MED ORDER — LIDOCAINE-EPINEPHRINE (PF) 1 %-1:200000 IJ SOLN
INTRAMUSCULAR | Status: DC | PRN
  Administered 2018-05-13: 25 mL

## 2018-05-13 MED ORDER — CEFAZOLIN SODIUM-DEXTROSE 2-4 GM/100ML-% IV SOLN
INTRAVENOUS | Status: AC
  Filled 2018-05-13: qty 100

## 2018-05-13 MED ORDER — ONDANSETRON HCL 4 MG/2ML IJ SOLN
INTRAMUSCULAR | Status: AC
  Filled 2018-05-13: qty 8

## 2018-05-13 MED ORDER — EPHEDRINE SULFATE-NACL 50-0.9 MG/10ML-% IV SOSY
PREFILLED_SYRINGE | INTRAVENOUS | Status: DC | PRN
  Administered 2018-05-13 (×2): 10 mg via INTRAVENOUS

## 2018-05-13 MED ORDER — LIDOCAINE 2% (20 MG/ML) 5 ML SYRINGE
INTRAMUSCULAR | Status: AC
  Filled 2018-05-13: qty 5

## 2018-05-13 MED ORDER — LACTATED RINGERS IV SOLN
INTRAVENOUS | Status: DC
  Administered 2018-05-13 (×2): via INTRAVENOUS

## 2018-05-13 MED ORDER — ACETAMINOPHEN 500 MG PO TABS
1000.0000 mg | ORAL_TABLET | ORAL | Status: AC
  Administered 2018-05-13: 1000 mg via ORAL

## 2018-05-13 MED ORDER — CELECOXIB 200 MG PO CAPS
200.0000 mg | ORAL_CAPSULE | ORAL | Status: AC
  Administered 2018-05-13: 200 mg via ORAL

## 2018-05-13 MED ORDER — GABAPENTIN 300 MG PO CAPS
ORAL_CAPSULE | ORAL | Status: AC
  Filled 2018-05-13: qty 1

## 2018-05-13 MED ORDER — PROMETHAZINE HCL 25 MG/ML IJ SOLN: 6.2500 mg | INTRAMUSCULAR | Status: DC | PRN

## 2018-05-13 MED ORDER — SCOPOLAMINE 1 MG/3DAYS TD PT72: 1.0000 | MEDICATED_PATCH | Freq: Once | TRANSDERMAL | Status: DC | PRN

## 2018-05-13 MED ORDER — SCOPOLAMINE 1 MG/3DAYS TD PT72: 1.0000 | MEDICATED_PATCH | Freq: Once | TRANSDERMAL | Status: DC

## 2018-05-13 MED ORDER — ONDANSETRON HCL 4 MG/2ML IJ SOLN
INTRAMUSCULAR | Status: AC
  Filled 2018-05-13: qty 4

## 2018-05-13 MED ORDER — TECHNETIUM TC 99M SULFUR COLLOID FILTERED: 1.0000 | Freq: Once | INTRAVENOUS | Status: DC | PRN

## 2018-05-13 MED ORDER — MIDAZOLAM HCL 2 MG/2ML IJ SOLN
INTRAMUSCULAR | Status: AC
  Filled 2018-05-13: qty 2

## 2018-05-13 MED ORDER — PROPOFOL 500 MG/50ML IV EMUL
INTRAVENOUS | Status: AC
  Filled 2018-05-13: qty 50

## 2018-05-13 MED ORDER — LIDOCAINE 2% (20 MG/ML) 5 ML SYRINGE
INTRAMUSCULAR | Status: DC | PRN
  Administered 2018-05-13: 60 mg via INTRAVENOUS

## 2018-05-13 MED ORDER — CHLORHEXIDINE GLUCONATE CLOTH 2 % EX PADS: 6.0000 | MEDICATED_PAD | Freq: Once | CUTANEOUS | Status: DC

## 2018-05-13 MED ORDER — FENTANYL CITRATE (PF) 100 MCG/2ML IJ SOLN
INTRAMUSCULAR | Status: AC
  Filled 2018-05-13: qty 2

## 2018-05-13 MED ORDER — MIDAZOLAM HCL 2 MG/2ML IJ SOLN
1.0000 mg | INTRAMUSCULAR | Status: DC | PRN
  Administered 2018-05-13: 2 mg via INTRAVENOUS

## 2018-05-13 MED ORDER — PHENYLEPHRINE 40 MCG/ML (10ML) SYRINGE FOR IV PUSH (FOR BLOOD PRESSURE SUPPORT)
PREFILLED_SYRINGE | INTRAVENOUS | Status: DC | PRN
  Administered 2018-05-13: 80 ug via INTRAVENOUS

## 2018-05-13 MED ORDER — GABAPENTIN 300 MG PO CAPS
300.0000 mg | ORAL_CAPSULE | ORAL | Status: AC
  Administered 2018-05-13: 300 mg via ORAL

## 2018-05-13 MED ORDER — ONDANSETRON HCL 4 MG/2ML IJ SOLN
INTRAMUSCULAR | Status: DC | PRN
  Administered 2018-05-13 (×2): 4 mg via INTRAVENOUS

## 2018-05-13 MED ORDER — PROPOFOL 10 MG/ML IV BOLUS
INTRAVENOUS | Status: DC | PRN
  Administered 2018-05-13: 150 mg via INTRAVENOUS
  Administered 2018-05-13: 50 mg via INTRAVENOUS

## 2018-05-13 MED ORDER — CEFAZOLIN SODIUM-DEXTROSE 2-4 GM/100ML-% IV SOLN
2.0000 g | INTRAVENOUS | Status: AC
  Administered 2018-05-13: 2 g via INTRAVENOUS

## 2018-05-13 MED ORDER — OXYCODONE HCL 5 MG PO TABS: 5.0000 mg | ORAL_TABLET | Freq: Once | ORAL | Status: DC

## 2018-05-13 MED ORDER — OXYCODONE HCL 5 MG PO TABS
ORAL_TABLET | ORAL | Status: AC
  Filled 2018-05-13: qty 1

## 2018-05-13 MED ORDER — DEXAMETHASONE SODIUM PHOSPHATE 10 MG/ML IJ SOLN
INTRAMUSCULAR | Status: AC
  Filled 2018-05-13: qty 1

## 2018-05-13 MED ORDER — FENTANYL CITRATE (PF) 100 MCG/2ML IJ SOLN
INTRAMUSCULAR | Status: DC | PRN
  Administered 2018-05-13: 50 ug via INTRAVENOUS

## 2018-05-13 MED ORDER — ACETAMINOPHEN 500 MG PO TABS
ORAL_TABLET | ORAL | Status: AC
  Filled 2018-05-13: qty 2

## 2018-05-13 MED ORDER — GLYCOPYRROLATE PF 0.2 MG/ML IJ SOSY
PREFILLED_SYRINGE | INTRAMUSCULAR | Status: AC
  Filled 2018-05-13: qty 1

## 2018-05-13 MED ORDER — FENTANYL CITRATE (PF) 100 MCG/2ML IJ SOLN
25.0000 ug | INTRAMUSCULAR | Status: DC | PRN
  Administered 2018-05-13: 50 ug via INTRAVENOUS

## 2018-05-13 MED ORDER — EPHEDRINE 5 MG/ML INJ
INTRAVENOUS | Status: AC
  Filled 2018-05-13: qty 10

## 2018-05-13 MED ORDER — PROPOFOL 10 MG/ML IV BOLUS
INTRAVENOUS | Status: AC
  Filled 2018-05-13: qty 20

## 2018-05-13 MED ORDER — BUPIVACAINE-EPINEPHRINE (PF) 0.5% -1:200000 IJ SOLN
INTRAMUSCULAR | Status: DC | PRN
  Administered 2018-05-13: 30 mL via PERINEURAL

## 2018-05-13 SURGICAL SUPPLY — 66 items
BINDER BREAST LRG (GAUZE/BANDAGES/DRESSINGS) IMPLANT
BINDER BREAST MEDIUM (GAUZE/BANDAGES/DRESSINGS) IMPLANT
BINDER BREAST XLRG (GAUZE/BANDAGES/DRESSINGS) IMPLANT
BINDER BREAST XXLRG (GAUZE/BANDAGES/DRESSINGS) ×3 IMPLANT
BLADE SURG 10 STRL SS (BLADE) ×3 IMPLANT
BLADE SURG 15 STRL LF DISP TIS (BLADE) ×1 IMPLANT
BLADE SURG 15 STRL SS (BLADE) ×2
BNDG COHESIVE 4X5 TAN STRL (GAUZE/BANDAGES/DRESSINGS) ×3 IMPLANT
CANISTER SUC SOCK COL 7IN (MISCELLANEOUS) IMPLANT
CANISTER SUCT 1200ML W/VALVE (MISCELLANEOUS) ×3 IMPLANT
CHLORAPREP W/TINT 26ML (MISCELLANEOUS) ×3 IMPLANT
CLIP VESOCCLUDE LG 6/CT (CLIP) ×3 IMPLANT
CLIP VESOCCLUDE MED 6/CT (CLIP) ×6 IMPLANT
CLIP VESOCCLUDE SM WIDE 6/CT (CLIP) IMPLANT
CLOSURE WOUND 1/2 X4 (GAUZE/BANDAGES/DRESSINGS) ×1
COVER MAYO STAND STRL (DRAPES) ×3 IMPLANT
COVER PROBE W GEL 5X96 (DRAPES) ×3 IMPLANT
DECANTER SPIKE VIAL GLASS SM (MISCELLANEOUS) IMPLANT
DERMABOND ADVANCED (GAUZE/BANDAGES/DRESSINGS) ×2
DERMABOND ADVANCED .7 DNX12 (GAUZE/BANDAGES/DRESSINGS) ×1 IMPLANT
DEVICE DUBIN W/COMP PLATE 8390 (MISCELLANEOUS) ×3 IMPLANT
DRAPE UTILITY XL STRL (DRAPES) ×3 IMPLANT
DRSG PAD ABDOMINAL 8X10 ST (GAUZE/BANDAGES/DRESSINGS) ×3 IMPLANT
DRSG TEGADERM 2-3/8X2-3/4 SM (GAUZE/BANDAGES/DRESSINGS) ×3 IMPLANT
ELECT COATED BLADE 2.86 ST (ELECTRODE) ×3 IMPLANT
ELECT REM PT RETURN 9FT ADLT (ELECTROSURGICAL) ×3
ELECTRODE REM PT RTRN 9FT ADLT (ELECTROSURGICAL) ×1 IMPLANT
GAUZE SPONGE 4X4 12PLY STRL LF (GAUZE/BANDAGES/DRESSINGS) ×3 IMPLANT
GLOVE BIO SURGEON STRL SZ 6 (GLOVE) ×3 IMPLANT
GLOVE BIO SURGEON STRL SZ 6.5 (GLOVE) ×2 IMPLANT
GLOVE BIO SURGEONS STRL SZ 6.5 (GLOVE) ×1
GLOVE BIOGEL PI IND STRL 6.5 (GLOVE) ×1 IMPLANT
GLOVE BIOGEL PI INDICATOR 6.5 (GLOVE) ×2
GLOVE EXAM NITRILE MD LF STRL (GLOVE) ×3 IMPLANT
GOWN STRL REUS W/ TWL LRG LVL3 (GOWN DISPOSABLE) ×1 IMPLANT
GOWN STRL REUS W/TWL 2XL LVL3 (GOWN DISPOSABLE) ×3 IMPLANT
GOWN STRL REUS W/TWL LRG LVL3 (GOWN DISPOSABLE) ×2
KIT MARKER MARGIN INK (KITS) ×3 IMPLANT
LIGHT WAVEGUIDE WIDE FLAT (MISCELLANEOUS) ×3 IMPLANT
NDL SAFETY ECLIPSE 18X1.5 (NEEDLE) IMPLANT
NEEDLE HYPO 18GX1.5 SHARP (NEEDLE)
NEEDLE HYPO 25X1 1.5 SAFETY (NEEDLE) ×3 IMPLANT
NS IRRIG 1000ML POUR BTL (IV SOLUTION) ×3 IMPLANT
PACK BASIN DAY SURGERY FS (CUSTOM PROCEDURE TRAY) ×3 IMPLANT
PACK UNIVERSAL I (CUSTOM PROCEDURE TRAY) ×3 IMPLANT
PENCIL BUTTON HOLSTER BLD 10FT (ELECTRODE) ×3 IMPLANT
SLEEVE SCD COMPRESS KNEE MED (MISCELLANEOUS) ×3 IMPLANT
SPONGE LAP 18X18 RF (DISPOSABLE) ×6 IMPLANT
STAPLER VISISTAT 35W (STAPLE) IMPLANT
STOCKINETTE IMPERVIOUS LG (DRAPES) ×3 IMPLANT
STRIP CLOSURE SKIN 1/2X4 (GAUZE/BANDAGES/DRESSINGS) ×2 IMPLANT
SUT ETHILON 2 0 FS 18 (SUTURE) IMPLANT
SUT MNCRL AB 4-0 PS2 18 (SUTURE) ×3 IMPLANT
SUT MON AB 5-0 PS2 18 (SUTURE) IMPLANT
SUT SILK 2 0 SH (SUTURE) IMPLANT
SUT VIC AB 2-0 SH 27 (SUTURE) ×2
SUT VIC AB 2-0 SH 27XBRD (SUTURE) ×1 IMPLANT
SUT VIC AB 3-0 SH 27 (SUTURE) ×2
SUT VIC AB 3-0 SH 27X BRD (SUTURE) ×1 IMPLANT
SUT VICRYL 3-0 CR8 SH (SUTURE) ×3 IMPLANT
SYR CONTROL 10ML LL (SYRINGE) ×3 IMPLANT
TOWEL GREEN STERILE FF (TOWEL DISPOSABLE) ×3 IMPLANT
TOWEL OR NON WOVEN STRL DISP B (DISPOSABLE) IMPLANT
TUBE CONNECTING 20'X1/4 (TUBING) ×1
TUBE CONNECTING 20X1/4 (TUBING) ×2 IMPLANT
YANKAUER SUCT BULB TIP NO VENT (SUCTIONS) ×3 IMPLANT

## 2018-05-13 NOTE — Progress Notes (Signed)
Assisted shannon, nuc med tech, with nuc med injections. Side rails up, monitors on throughout procedure. See vital signs in flow sheet. Tolerated Procedure well.

## 2018-05-13 NOTE — Discharge Instructions (Addendum)
[facility] Office Phone Number [phone]  BREAST BIOPSY/ PARTIAL MASTECTOMY: POST OP INSTRUCTIONS  Always review your discharge instruction sheet given to you by the facility where your surgery was performed.  IF YOU HAVE DISABILITY OR FAMILY LEAVE FORMS, YOU MUST BRING THEM TO THE OFFICE FOR PROCESSING.  DO NOT GIVE THEM TO YOUR DOCTOR.  1. A prescription for pain medication may be given to you upon discharge.  Take your pain medication as prescribed, if needed.  If narcotic pain medicine is not needed, then you may take acetaminophen (Tylenol) or ibuprofen (Advil) as needed. No Tylenol until 5pm, no ibuprofen until 7pm. May take oxycodone at 10:45pm. 2. Take your usually prescribed medications unless otherwise directed 3. If you need a refill on your pain medication, please contact your pharmacy.  They will contact our office to request authorization.  Prescriptions will not be filled after 5pm or on week-ends. 4. You should eat very light the first 24 hours after surgery, such as soup, crackers, pudding, etc.  Resume your normal diet the day after surgery. 5. Most patients will experience some swelling and bruising in the breast.  Ice packs and a good support bra will help.  Swelling and bruising can take several days to resolve.  6. It is common to experience some constipation if taking pain medication after surgery.  Increasing fluid intake and taking a stool softener will usually help or prevent this problem from occurring.  A mild laxative (Milk of Magnesia or Miralax) should be taken according to package directions if there are no bowel movements after 48 hours. 7. Unless discharge instructions indicate otherwise, you may remove your bandages 48 hours after surgery, and you may shower at that time.  You may have steri-strips (small skin tapes) in place directly over the incision.  These strips should be left on the skin for 7-10 days.   Any sutures or staples will be  removed at the office during your follow-up visit. 8. ACTIVITIES:  You may resume regular daily activities (gradually increasing) beginning the next day.  Wearing a good support bra or sports bra (or the breast binder) minimizes pain and swelling.  You may have sexual intercourse when it is comfortable. a. You may drive when you no longer are taking prescription pain medication, you can comfortably wear a seatbelt, and you can safely maneuver your car and apply brakes. b. RETURN TO WORK:  __________1 week_______________ 9. You should see your doctor in the office for a follow-up appointment approximately two weeks after your surgery.  Your doctors nurse will typically make your follow-up appointment when she calls you with your pathology report.  Expect your pathology report 2-3 business days after your surgery.  You may call to check if you do not hear from [place] after three days.   WHEN TO CALL YOUR DOCTOR: 1. Fever over 101.0 2. Nausea and/or vomiting. 3. Extreme swelling or bruising. 4. Continued bleeding from incision. 5. Increased pain, redness, or drainage from the incision.  The clinic staff is available to answer your questions during regular business hours.  Please dont hesitate to call and ask to speak to one of the nurses for clinical concerns.  If you have a medical emergency, go to the nearest emergency room or call 911.  A surgeon from [facility] is always on call at the hospital.  For further questions, please visit [email or web address]     Post Anesthesia Home Care Instructions  Activity: Get plenty of rest for  the remainder of the day. A responsible individual must stay with you for 24 hours following the procedure.  For the next 24 hours, DO NOT: -Drive a car -Paediatric nurse -Drink alcoholic beverages -Take any medication unless instructed by your physician -Make any legal decisions or sign important papers.  Meals: Start with liquid foods such  as gelatin or soup. Progress to regular foods as tolerated. Avoid greasy, spicy, heavy foods. If nausea and/or vomiting occur, drink only clear liquids until the nausea and/or vomiting subsides. Call your physician if vomiting continues.  Special Instructions/Symptoms: Your throat may feel dry or sore from the anesthesia or the breathing tube placed in your throat during surgery. If this causes discomfort, gargle with warm salt water. The discomfort should disappear within 24 hours.  If you had a scopolamine patch placed behind your ear for the management of post- operative nausea and/or vomiting:  1. The medication in the patch is effective for 72 hours, after which it should be removed.  Wrap patch in a tissue and discard in the trash. Wash hands thoroughly with soap and water. 2. You may remove the patch earlier than 72 hours if you experience unpleasant side effects which may include dry mouth, dizziness or visual disturbances. 3. Avoid touching the patch. Wash your hands with soap and water after contact with the patch.

## 2018-05-13 NOTE — Anesthesia Procedure Notes (Signed)
Procedure Name: LMA Insertion Date/Time: 05/13/2018 1:51 PM Performed by: Gwyndolyn Saxon, CRNA Pre-anesthesia Checklist: Patient identified, Emergency Drugs available, Suction available and Patient being monitored Patient Re-evaluated:Patient Re-evaluated prior to induction Oxygen Delivery Method: Circle system utilized Preoxygenation: Pre-oxygenation with 100% oxygen Induction Type: IV induction Ventilation: Mask ventilation without difficulty LMA: LMA inserted LMA Size: 4.0 Number of attempts: 1 Placement Confirmation: positive ETCO2,  CO2 detector and breath sounds checked- equal and bilateral Tube secured with: Tape Dental Injury: Teeth and Oropharynx as per pre-operative assessment

## 2018-05-13 NOTE — Anesthesia Preprocedure Evaluation (Addendum)
Anesthesia Evaluation  Patient identified by MRN, date of birth, ID band Patient awake    Reviewed: Allergy & Precautions, NPO status , Patient's Chart, lab work & pertinent test results  Airway Mallampati: II  TM Distance: >3 FB Neck ROM: Full    Dental  (+) Teeth Intact, Dental Advisory Given   Pulmonary neg pulmonary ROS,    Pulmonary exam normal breath sounds clear to auscultation       Cardiovascular hypertension, Pt. on medications Normal cardiovascular exam Rhythm:Regular Rate:Normal     Neuro/Psych negative neurological ROS  negative psych ROS   GI/Hepatic negative GI ROS, Neg liver ROS,   Endo/Other  negative endocrine ROSObesity   Renal/GU negative Renal ROS     Musculoskeletal  (+) Arthritis , Osteoarthritis,    Abdominal   Peds  Hematology  (+) Blood dyscrasia, Sickle cell trait ,   Anesthesia Other Findings Day of surgery medications reviewed with the patient.  Breast cancer, right   Reproductive/Obstetrics                             Anesthesia Physical Anesthesia Plan  ASA: II  Anesthesia Plan: General   Post-op Pain Management:  Regional for Post-op pain   Induction: Intravenous  PONV Risk Score and Plan: 3 and Midazolam, Dexamethasone, Ondansetron and Scopolamine patch - Pre-op  Airway Management Planned: LMA  Additional Equipment:   Intra-op Plan:   Post-operative Plan: Extubation in OR  Informed Consent: I have reviewed the patients History and Physical, chart, labs and discussed the procedure including the risks, benefits and alternatives for the proposed anesthesia with the patient or authorized representative who has indicated his/her understanding and acceptance.   Dental advisory given  Plan Discussed with: CRNA  Anesthesia Plan Comments:         Anesthesia Quick Evaluation

## 2018-05-13 NOTE — Interval H&P Note (Signed)
History and Physical Interval Note:  05/13/2018 1:38 PM  Donna Gould  has presented today for surgery, with the diagnosis of RIGHT BREAST CANCER  The various methods of treatment have been discussed with the patient and family. After consideration of risks, benefits and other options for treatment, the patient has consented to  Procedure(s): RIGHT BREAST LUMPECTOMY WITH RADIOACTIVE SEED AND RIGHT SENTINEL LYMPH NODE BIOPSY (Right) as a surgical intervention .  The patient's history has been reviewed, patient examined, no change in status, stable for surgery.  I have reviewed the patient's chart and labs.  Questions were answered to the patient's satisfaction.     [name]

## 2018-05-13 NOTE — Anesthesia Postprocedure Evaluation (Signed)
Anesthesia Post Note  Patient: Donna Gould  Procedure(s) Performed: RIGHT BREAST LUMPECTOMY WITH RADIOACTIVE SEED AND RIGHT SENTINEL LYMPH NODE BIOPSY (Right Breast)     Patient location during evaluation: PACU Anesthesia Type: General Level of consciousness: awake and alert, oriented and awake Pain management: pain level controlled Vital Signs Assessment: post-procedure vital signs reviewed and stable Respiratory status: spontaneous breathing, nonlabored ventilation and respiratory function stable Cardiovascular status: blood pressure returned to baseline and stable Postop Assessment: no apparent nausea or vomiting Anesthetic complications: no    Last Vitals:  Vitals:   05/13/18 1615 05/13/18 1634  BP: (!) 148/83 (!) 151/76  Pulse: 64 61  Resp: 15 16  Temp:  36.6 C  SpO2: 96% 100%    Last Pain:  Vitals:   05/13/18 1634  TempSrc: Oral  PainSc: 4                  Catalina Gravel

## 2018-05-13 NOTE — Anesthesia Procedure Notes (Signed)
Anesthesia Regional Block: Pectoralis block   Pre-Anesthetic Checklist: ,, timeout performed, Correct Patient, Correct Site, Correct Laterality, Correct Procedure, Correct Position, site marked, Risks and benefits discussed,  Surgical consent,  Pre-op evaluation,  At surgeon's request and post-op pain management  Laterality: Right  Prep: chloraprep       Needles:  Injection technique: Single-shot  Needle Type: Echogenic Needle     Needle Length: 9cm  Needle Gauge: 21     Additional Needles:   Procedures:,,,, ultrasound used (permanent image in chart),,,,  Narrative:  Start time: 05/13/2018 12:35 PM End time: 05/13/2018 12:40 PM Injection made incrementally with aspirations every 5 mL.  Performed by: Personally  Anesthesiologist: Catalina Gravel, MD  Additional Notes: No pain on injection. No increased resistance to injection. Injection made in 5cc increments.  Good needle visualization.  Patient tolerated procedure well.

## 2018-05-13 NOTE — Transfer of Care (Signed)
Immediate Anesthesia Transfer of Care Note  Patient: Donna Gould  Procedure(s) Performed: RIGHT BREAST LUMPECTOMY WITH RADIOACTIVE SEED AND RIGHT SENTINEL LYMPH NODE BIOPSY (Right Breast)  Patient Location: PACU  Anesthesia Type:GA combined with regional for post-op pain  Level of Consciousness: awake and patient cooperative  Airway & Oxygen Therapy: Patient Spontanous Breathing and Patient connected to face mask oxygen  Post-op Assessment: Report given to RN and Post -op Vital signs reviewed and stable  Post vital signs: Reviewed and stable  Last Vitals:  Vitals Value Taken Time  BP    Temp    Pulse 77 05/13/2018  3:11 PM  Resp    SpO2 100 % 05/13/2018  3:11 PM  Vitals shown include unvalidated device data.  Last Pain:  Vitals:   05/13/18 1049  TempSrc: Oral  PainSc: 0-No pain         Complications: No apparent anesthesia complications

## 2018-05-13 NOTE — Progress Notes (Signed)
Assisted Dr. Gifford Shave with right, ultrasound guided, pectoralis block. Side rails up, monitors on throughout procedure. See vital signs in flow sheet. Tolerated Procedure well.

## 2018-05-13 NOTE — Op Note (Signed)
Right Breast Radioactive seed localized lumpectomy with sentinel lymph node biopsy  Indications: This patient presents with history of right breast cancer, upper outer quadrant, +/-/-, grade 3, cT1bN0  Pre-operative Diagnosis: right breast cancer  Post-operative Diagnosis: Same  Surgeon: [name]   Anesthesia: General endotracheal anesthesia  ASA Class: 2  Procedure Details  The patient was seen in the Holding Room. The risks, benefits, complications, treatment options, and expected outcomes were discussed with the patient. The possibilities of bleeding, infection, the need for additional procedures, failure to diagnose a condition, and creating a complication requiring transfusion or operation were discussed with the patient. The patient concurred with the proposed plan, giving informed consent.  The site of surgery properly noted/marked. The patient was taken to Operating Room # 2, identified, and the procedure verified as Right Breast seed localized Lumpectomy with sentinel lymph node biopsy. A Time Out was held and the above information confirmed.  The right arm, breast, and chest were prepped and draped in standard fashion. The lumpectomy was performed by creating a lateral circumareolar incision near the previously placed radioactive seed.  Dissection was carried down around the point of maximum signal intensity. The cautery was used to perform the dissection.  Hemostasis was achieved with cautery. The edges of the cavity were marked with large clips, with one each medial, lateral, inferior and superior, and two clips posteriorly.   The specimen was inked with the margin marker paint kit.    Specimen radiography confirmed inclusion of the mammographic lesion, the clip, and the seed.  The background signal in the breast was zero.  The wound was irrigated and closed with 3-0 vicryl in layers and 4-0 monocryl subcuticular suture.      Using a hand-held gamma probe, right axillary sentinel  nodes were identified transcutaneously.  An oblique incision was created below the axillary hairline.  Dissection was carried through the clavipectoral fascia.  Four deep level 2 axillary sentinel nodes were removed.  Counts per second were 350, 180, 120, and 290.    The background count was 20 cps.  The wound was irrigated.  Hemostasis was achieved with cautery.  The axillary incision was closed with a 3-0 vicryl deep dermal interrupted sutures and a 4-0 monocryl subcuticular closure  Sterile dressings were applied. At the end of the operation, all sponge, instrument, and needle counts were correct.  Findings: grossly clear surgical margins and no adenopathy.  Anterior margin is skin.    Estimated Blood Loss:  min         Specimens: right breast lumpectomy with seed and four sentinel lymph nodes.           Complications:  None; patient tolerated the procedure well.         Disposition: PACU - hemodynamically stable.         Condition: stable

## 2018-05-14 ENCOUNTER — Encounter (HOSPITAL_BASED_OUTPATIENT_CLINIC_OR_DEPARTMENT_OTHER): Payer: Self-pay | Admitting: General Surgery

## 2018-05-19 ENCOUNTER — Telehealth: Payer: Self-pay | Admitting: General Surgery

## 2018-05-19 ENCOUNTER — Other Ambulatory Visit: Payer: Self-pay | Admitting: General Surgery

## 2018-05-19 NOTE — Telephone Encounter (Signed)
Left message about positive margin.  Negative nodes.

## 2018-05-20 ENCOUNTER — Inpatient Hospital Stay: Payer: BLUE CROSS/BLUE SHIELD | Attending: Hematology and Oncology | Admitting: Hematology and Oncology

## 2018-05-20 ENCOUNTER — Telehealth: Payer: Self-pay | Admitting: *Deleted

## 2018-05-20 DIAGNOSIS — C50411 Malignant neoplasm of upper-outer quadrant of right female breast: Secondary | ICD-10-CM | POA: Diagnosis present

## 2018-05-20 DIAGNOSIS — Z170 Estrogen receptor positive status [ER+]: Secondary | ICD-10-CM | POA: Diagnosis not present

## 2018-05-20 NOTE — Assessment & Plan Note (Addendum)
05/13/2018:Right lumpectomy: IDC with DCIS, 1.2 cm, focally involves lateral margin, 0/4 lymph nodes negative, ER 50%, PR 0%, HER-2 negative, Ki-67 40%, T1 CN 0 stage Ia  Pathology counseling: I discussed the final pathology report of the patient provided  a copy of this report. I discussed the margins as well as lymph node surgeries. We also discussed the final staging along with previously performed ER/PR and HER-2/neu testing.  Recommendation: 1.  Resection of the positive margin 2. Oncotype DX testing 3.  Adjuvant radiation therapy 4.  Followed by adjuvant antiestrogen therapy  Oncotype counseling: I discussed Oncotype DX test. I explained to the patient that this is a 21 gene panel to evaluate patient tumors DNA to calculate recurrence score. This would help determine whether patient has high risk or intermediate risk or low risk breast cancer. She understands that if her tumor was found to be high risk, she would benefit from systemic chemotherapy. If low risk, no need of chemotherapy. If she was found to be intermediate risk, we would need to evaluate the score as well as other risk factors and determine if an abbreviated chemotherapy may be of benefit.  Return to clinic based upon Oncotype DX test result

## 2018-05-20 NOTE — Progress Notes (Signed)
Patient Care Team: Andres Shad, MD as PCP - General (Family Medicine)  DIAGNOSIS:  Encounter Diagnosis  Name Primary?  . Malignant neoplasm of upper-outer quadrant of right breast in female, estrogen receptor positive ([place])     SUMMARY OF ONCOLOGIC HISTORY:   Malignant neoplasm of upper-outer quadrant of right breast in female, estrogen receptor positive ([place])   04/08/2018 Initial Diagnosis    Screening detected right breast mass at 9:30 position measuring 9 mm, biopsy revealed IDC intermediate grade, ER 50%, PR negative HER-2 negative, Ki-67 40%, T1b N0 stage Ia     Genetic Testing    Negative genetic testing on the [facility] Breast Cancer STAT + Common Hereditary Cancers Panel. The STAT Breast cancer panel offered by [facility] includes sequencing and rearrangement analysis for the following 9 genes:  ATM, BRCA1, BRCA2, CDH1, CHEK2, PALB2, PTEN, STK11 and TP53.  The Common Hereditary Cancers Panel offered by [facility] includes sequencing and/or deletion duplication testing of the following 47 genes: APC, ATM, AXIN2, BARD1, BMPR1A, BRCA1, BRCA2, BRIP1, CDH1, CDKN2A (p14ARF), CDKN2A (p16INK4a), CKD4, CHEK2, CTNNA1, DICER1, EPCAM (Deletion/duplication testing only), GREM1 (promoter region deletion/duplication testing only), KIT, MEN1, MLH1, MSH2, MSH3, MSH6, MUTYH, NBN, NF1, NHTL1, PALB2, PDGFRA, PMS2, POLD1, POLE, PTEN, RAD50, RAD51C, RAD51D, SDHB, SDHC, SDHD, SMAD4, SMARCA4. STK11, TP53, TSC1, TSC2, and VHL.  The following genes were evaluated for sequence changes only: SDHA and HOXB13 c.251G>A variant only.  The report date is 05/06/18    05/13/2018 Surgery    Right lumpectomy: IDC with DCIS, 1.2 cm, focally involves lateral margin, 0/4 lymph nodes negative, ER 50%, PR 0%, HER-2 negative, Ki-67 40%, T1 CN 0 stage Ia     CHIEF COMPLIANT: Follow-up after recent right lumpectomy  INTERVAL HISTORY: Donna Gould is a 57 year old with above-mentioned history of right breast cancer who  underwent lumpectomy and is here today to discuss the pathology report.  She is complaining of soreness in the axilla and some discomfort.  Denies any fevers or chills.  She had a positive lateral margin and will be seeing Dr. Barry Dienes to discuss resection of that margin.  REVIEW OF SYSTEMS:   Constitutional: Denies fevers, chills or abnormal weight loss Eyes: Denies blurriness of vision Ears, nose, mouth, throat, and face: Denies mucositis or sore throat Respiratory: Denies cough, dyspnea or wheezes Cardiovascular: Denies palpitation, chest discomfort Gastrointestinal:  Denies nausea, heartburn or change in bowel habits Skin: Denies abnormal skin rashes Lymphatics: Denies new lymphadenopathy or easy bruising Neurological:Denies numbness, tingling or new weaknesses Behavioral/Psych: Mood is stable, no new changes  Extremities: No lower extremity edema Breast: Recent right lumpectomy and sentinel lymph node biopsy.  Complaining of soreness in the axilla. All other systems were reviewed with the patient and are negative.  I have reviewed the past medical history, past surgical history, social history and family history with the patient and they are unchanged from previous note.  ALLERGIES:  has no allergies on file.  MEDICATIONS:  Current Outpatient Medications  Medication Sig Dispense Refill  . lisinopril-hydrochlorothiazide (PRINZIDE,ZESTORETIC) 20-25 MG tablet Take 1 tablet by mouth every morning.  3  . LO LOESTRIN FE 1 MG-10 MCG / 10 MCG tablet Take 1 tablet by mouth daily.  12  . oxyCODONE (OXY IR/ROXICODONE) 5 MG immediate release tablet Take 1 tablet (5 mg total) by mouth every 6 (six) hours as needed for severe pain. 15 tablet 0   No current facility-administered medications for this visit.     PHYSICAL EXAMINATION: ECOG PERFORMANCE STATUS:  1 - Symptomatic but completely ambulatory  Vitals:   05/20/18 1036  BP: 125/64  Pulse: (!) 54  Resp: 17  Temp: 98 F (36.7 C)  SpO2:  100%   Filed Weights   05/20/18 1036  Weight: 235 lb 3.2 oz (106.7 kg)    GENERAL:alert, no distress and comfortable SKIN: skin color, texture, turgor are normal, no rashes or significant lesions EYES: normal, Conjunctiva are pink and non-injected, sclera clear OROPHARYNX:no exudate, no erythema and lips, buccal mucosa, and tongue normal  NECK: supple, thyroid normal size, non-tender, without nodularity LYMPH:  no palpable lymphadenopathy in the cervical, axillary or inguinal LUNGS: clear to auscultation and percussion with normal breathing effort HEART: regular rate & rhythm and no murmurs and no lower extremity edema ABDOMEN:abdomen soft, non-tender and normal bowel sounds MUSCULOSKELETAL:no cyanosis of digits and no clubbing  NEURO: alert & oriented x 3 with fluent speech, no focal motor/sensory deficits EXTREMITIES: No lower extremity edema   LABORATORY DATA:  I have reviewed the data as listed CMP Latest Ref Rng & Units 05/12/2018  Glucose 70 - 99 mg/dL 122(H)  BUN 6 - 20 mg/dL 10  Creatinine 0.44 - 1.00 mg/dL 1.17(H)  Sodium 135 - 145 mmol/L 139  Potassium 3.5 - 5.1 mmol/L 3.7  Chloride 98 - 111 mmol/L 102  CO2 22 - 32 mmol/L 27  Calcium 8.9 - 10.3 mg/dL 8.8(L)  Total Protein 6.5 - 8.1 g/dL 7.8  Total Bilirubin 0.3 - 1.2 mg/dL 1.0  Alkaline Phos 38 - 126 U/L 45  AST 15 - 41 U/L 20  ALT 0 - 44 U/L 16    Lab Results  Component Value Date   WBC 4.4 05/12/2018   HGB 12.5 05/12/2018   HCT 38.4 05/12/2018   MCV 80.8 05/12/2018   PLT 297 05/12/2018   NEUTROABS 2.1 05/12/2018    ASSESSMENT & PLAN:  Malignant neoplasm of upper-outer quadrant of right breast in female, estrogen receptor positive ([facility]) 05/13/2018:Right lumpectomy: IDC with DCIS, 1.2 cm, focally involves lateral margin, 0/4 lymph nodes negative, ER 50%, PR 0%, HER-2 negative, Ki-67 40%, T1 CN 0 stage Ia  Pathology counseling: I discussed the final pathology report of the patient provided  a copy of this  report. I discussed the margins as well as lymph node surgeries. We also discussed the final staging along with previously performed ER/PR and HER-2/neu testing.  Recommendation: 1.  Resection of the positive margin 2. Oncotype DX testing 3.  Adjuvant radiation therapy 4.  Followed by adjuvant antiestrogen therapy  Oncotype counseling: I discussed Oncotype DX test. I explained to the patient that this is a 21 gene panel to evaluate patient tumors DNA to calculate recurrence score. This would help determine whether patient has high risk or intermediate risk or low risk breast cancer. She understands that if her tumor was found to be high risk, she would benefit from systemic chemotherapy. If low risk, no need of chemotherapy. If she was found to be intermediate risk, we would need to evaluate the score as well as other risk factors and determine if an abbreviated chemotherapy may be of benefit.  Return to clinic based upon Oncotype DX test result    No orders of the defined types were placed in this encounter.  The patient has a good understanding of the overall plan. she agrees with it. she will call with any problems that may develop before the next visit here.   Viinay K Gudena, MD 05/20/18    

## 2018-05-20 NOTE — Telephone Encounter (Signed)
Ordered oncotype per Dr. Gudena.  Faxed requisition to pathology and confirmed receipt. 

## 2018-05-22 ENCOUNTER — Other Ambulatory Visit: Payer: Self-pay | Admitting: General Surgery

## 2018-05-22 NOTE — Progress Notes (Signed)
FMLA for spouse, Donna Gould, successfully faxed to [employer] at [phone]. Mailed copy to patient address on file.

## 2018-06-01 ENCOUNTER — Encounter (HOSPITAL_COMMUNITY): Payer: Self-pay | Admitting: Hematology and Oncology

## 2018-06-02 ENCOUNTER — Telehealth: Payer: Self-pay | Admitting: *Deleted

## 2018-06-02 SURGERY — Surgical Case
Anesthesia: *Unknown

## 2018-06-02 NOTE — Telephone Encounter (Signed)
Received oncotype score of 31/19%. Physician team notified. VM left for pt to return call regarding appt to discuss results.

## 2018-06-02 NOTE — Telephone Encounter (Signed)
Spoke to pt regarding appt to discuss oncotype results with Dr Lindi Adie. Scheduled and confirmed appt at 4:15 on 9/18.

## 2018-06-03 ENCOUNTER — Inpatient Hospital Stay (HOSPITAL_BASED_OUTPATIENT_CLINIC_OR_DEPARTMENT_OTHER): Payer: BLUE CROSS/BLUE SHIELD | Admitting: Hematology and Oncology

## 2018-06-03 DIAGNOSIS — C50411 Malignant neoplasm of upper-outer quadrant of right female breast: Secondary | ICD-10-CM | POA: Diagnosis not present

## 2018-06-03 DIAGNOSIS — Z170 Estrogen receptor positive status [ER+]: Secondary | ICD-10-CM

## 2018-06-03 NOTE — Progress Notes (Signed)
 Patient Care Team: Winfield, Albert Carl, MD as PCP - General (Family Medicine)  DIAGNOSIS:  Encounter Diagnosis  Name Primary?  . Malignant neoplasm of upper-outer quadrant of right breast in female, estrogen receptor positive ([facility])     SUMMARY OF ONCOLOGIC HISTORY:   Malignant neoplasm of upper-outer quadrant of right breast in female, estrogen receptor positive ([facility])   03/13/2018 Oncotype testing    Oncotype DX recurrence score 31, 19% risk of recurrence with Oncotype alone and greater than 15% absolute reduction in chemotherapy benefit    04/08/2018 Initial Diagnosis    Screening detected right breast mass at 9:30 position measuring 9 mm, biopsy revealed IDC intermediate grade, ER 50%, PR negative HER-2 negative, Ki-67 40%, T1b N0 stage Ia     Genetic Testing    Negative genetic testing on the [facility] Breast Cancer STAT + Common Hereditary Cancers Panel. The STAT Breast cancer panel offered by [facility] includes sequencing and rearrangement analysis for the following 9 genes:  ATM, BRCA1, BRCA2, CDH1, CHEK2, PALB2, PTEN, STK11 and TP53.  The Common Hereditary Cancers Panel offered by [facility] includes sequencing and/or deletion duplication testing of the following 47 genes: APC, ATM, AXIN2, BARD1, BMPR1A, BRCA1, BRCA2, BRIP1, CDH1, CDKN2A (p14ARF), CDKN2A (p16INK4a), CKD4, CHEK2, CTNNA1, DICER1, EPCAM (Deletion/duplication testing only), GREM1 (promoter region deletion/duplication testing only), KIT, MEN1, MLH1, MSH2, MSH3, MSH6, MUTYH, NBN, NF1, NHTL1, PALB2, PDGFRA, PMS2, POLD1, POLE, PTEN, RAD50, RAD51C, RAD51D, SDHB, SDHC, SDHD, SMAD4, SMARCA4. STK11, TP53, TSC1, TSC2, and VHL.  The following genes were evaluated for sequence changes only: SDHA and HOXB13 c.251G>A variant only.  The report date is 05/06/18    05/13/2018 Surgery    Right lumpectomy: IDC with DCIS, 1.2 cm, focally involves lateral margin, 0/4 lymph nodes negative, ER 50%, PR 0%, HER-2 negative, Ki-67 40%, T1 CN 0 stage  Ia    05/27/2018 Cancer Staging    Staging form: Breast, AJCC 8th Edition - Pathologic: Stage IA (pT1c, pN0, cM0, G2, ER+, PR-, HER2-) - Signed by Causey, Lindsey Cornetto, NP on 05/27/2018     CHIEF COMPLIANT: Follow-up to discuss results of Oncotype DX  INTERVAL HISTORY: Donna Gould is a 57-year-old with above-mentioned history of right breast cancer underwent lumpectomy and we performed Oncotype DX and is here today to discuss the results.  Oncotype DX revealed recurrence score of 31 which falls in the high risk category.  She is here today to discuss the chemotherapy treatment plan.  She has recovered very well from the prior surgery.  REVIEW OF SYSTEMS:   Constitutional: Denies fevers, chills or abnormal weight loss Eyes: Denies blurriness of vision Ears, nose, mouth, throat, and face: Denies mucositis or sore throat Respiratory: Denies cough, dyspnea or wheezes Cardiovascular: Denies palpitation, chest discomfort Gastrointestinal:  Denies nausea, heartburn or change in bowel habits Skin: Denies abnormal skin rashes Lymphatics: Denies new lymphadenopathy or easy bruising Neurological:Denies numbness, tingling or new weaknesses Behavioral/Psych: Mood is stable, no new changes  Extremities: No lower extremity edema All other systems were reviewed with the patient and are negative.  I have reviewed the past medical history, past surgical history, social history and family history with the patient and they are unchanged from previous note.  ALLERGIES:  has no allergies on file.  MEDICATIONS:  Current Outpatient Medications  Medication Sig Dispense Refill  . lisinopril-hydrochlorothiazide (PRINZIDE,ZESTORETIC) 20-25 MG tablet Take 1 tablet by mouth every morning.  3  . LO LOESTRIN FE 1 MG-10 MCG / 10 MCG tablet Take 1 tablet   by mouth daily.  12  . oxyCODONE (OXY IR/ROXICODONE) 5 MG immediate release tablet Take 1 tablet (5 mg total) by mouth every 6 (six) hours as needed for  severe pain. 15 tablet 0   No current facility-administered medications for this visit.     PHYSICAL EXAMINATION: ECOG PERFORMANCE STATUS: 1 - Symptomatic but completely ambulatory  There were no vitals filed for this visit. There were no vitals filed for this visit.  GENERAL:alert, no distress and comfortable SKIN: skin color, texture, turgor are normal, no rashes or significant lesions EYES: normal, Conjunctiva are pink and non-injected, sclera clear OROPHARYNX:no exudate, no erythema and lips, buccal mucosa, and tongue normal  NECK: supple, thyroid normal size, non-tender, without nodularity LYMPH:  no palpable lymphadenopathy in the cervical, axillary or inguinal LUNGS: clear to auscultation and percussion with normal breathing effort HEART: regular rate & rhythm and no murmurs and no lower extremity edema ABDOMEN:abdomen soft, non-tender and normal bowel sounds MUSCULOSKELETAL:no cyanosis of digits and no clubbing  NEURO: alert & oriented x 3 with fluent speech, no focal motor/sensory deficits EXTREMITIES: No lower extremity edema   LABORATORY DATA:  I have reviewed the data as listed CMP Latest Ref Rng & Units 05/12/2018  Glucose 70 - 99 mg/dL 122(H)  BUN 6 - 20 mg/dL 10  Creatinine 0.44 - 1.00 mg/dL 1.17(H)  Sodium 135 - 145 mmol/L 139  Potassium 3.5 - 5.1 mmol/L 3.7  Chloride 98 - 111 mmol/L 102  CO2 22 - 32 mmol/L 27  Calcium 8.9 - 10.3 mg/dL 8.8(L)  Total Protein 6.5 - 8.1 g/dL 7.8  Total Bilirubin 0.3 - 1.2 mg/dL 1.0  Alkaline Phos 38 - 126 U/L 45  AST 15 - 41 U/L 20  ALT 0 - 44 U/L 16    Lab Results  Component Value Date   WBC 4.4 05/12/2018   HGB 12.5 05/12/2018   HCT 38.4 05/12/2018   MCV 80.8 05/12/2018   PLT 297 05/12/2018   NEUTROABS 2.1 05/12/2018    ASSESSMENT & PLAN:  Malignant neoplasm of upper-outer quadrant of right breast in female, estrogen receptor positive ([place]) 05/13/2018:Right lumpectomy: IDC with DCIS, 1.2 cm, focally involves lateral  margin, 0/4 lymph nodes negative, ER 50%, PR 0%, HER-2 negative, Ki-67 40%, T1 CN 0 stage Ia Oncotype DX recurrence score 31: 19% risk of distant recurrence of 9 years with hormone therapy alone Chemo benefit greater than 15%  Recommendation: 1.  Adjuvant chemotherapy with dose dense indomethacin and Cytoxan x4 followed by Taxol weekly x12  2.  Adjuvant radiation therapy 3.  Followed by adjuvant antiestrogen therapy  Chemotherapy Counseling: I discussed the risks and benefits of chemotherapy including the risks of nausea/ vomiting, risk of infection from low WBC count, fatigue due to chemo or anemia, bruising or bleeding due to low platelets, mouth sores, loss/ change in taste and decreased appetite. Liver and kidney function will be monitored through out chemotherapy as abnormalities in liver and kidney function may be a side effect of treatment. Cardiac dysfunction due to Adriamycin were discussed in detail. Risk of permanent bone marrow dysfunction and leukemia due to chemo were also discussed.  Plan: 1.  Port placement 2. Echocardiogram 3.  Chemo class  UPBEAT clinical trial ([id number]): Newly diagnosed stage I to III breast cancer patients receiving either adjuvant or neoadjuvant chemotherapy undergo cardiac MRI before treatment and at 24 months along with neurocognitive testing, exercise and disability measures at baseline 3, 12 and 24 months.  Return to clinic  to start chemotherapy in 2 weeks    No orders of the defined types were placed in this encounter.  The patient has a good understanding of the overall plan. she agrees with it. she will call with any problems that may develop before the next visit here.   Viinay K Gudena, MD 06/03/18    

## 2018-06-03 NOTE — Assessment & Plan Note (Signed)
05/13/2018:Right lumpectomy: IDC with DCIS, 1.2 cm, focally involves lateral margin, 0/4 lymph nodes negative, ER 50%, PR 0%, HER-2 negative, Ki-67 40%, T1 CN 0 stage Ia Oncotype DX recurrence score 31: 19% risk of distant recurrence of 9 years with hormone therapy alone Chemo benefit greater than 15%  Recommendation: 1.  Adjuvant chemotherapy with dose dense indomethacin and Cytoxan x4 followed by Taxol weekly x12  2.  Adjuvant radiation therapy 3.  Followed by adjuvant antiestrogen therapy  Chemotherapy Counseling: I discussed the risks and benefits of chemotherapy including the risks of nausea/ vomiting, risk of infection from low WBC count, fatigue due to chemo or anemia, bruising or bleeding due to low platelets, mouth sores, loss/ change in taste and decreased appetite. Liver and kidney function will be monitored through out chemotherapy as abnormalities in liver and kidney function may be a side effect of treatment. Cardiac dysfunction due to Adriamycin were discussed in detail. Risk of permanent bone marrow dysfunction and leukemia due to chemo were also discussed.  Plan: 1.  Port placement 2. Echocardiogram 3.  Chemo class  UPBEAT clinical trial ([id number]): Newly diagnosed stage I to III breast cancer patients receiving either adjuvant or neoadjuvant chemotherapy undergo cardiac MRI before treatment and at 24 months along with neurocognitive testing, exercise and disability measures at baseline 3, 12 and 24 months.  Return to clinic to start chemotherapy in 2 weeks

## 2018-06-04 ENCOUNTER — Other Ambulatory Visit: Payer: Self-pay | Admitting: *Deleted

## 2018-06-04 ENCOUNTER — Telehealth: Payer: Self-pay | Admitting: Hematology and Oncology

## 2018-06-04 DIAGNOSIS — Z170 Estrogen receptor positive status [ER+]: Principal | ICD-10-CM

## 2018-06-04 DIAGNOSIS — C50411 Malignant neoplasm of upper-outer quadrant of right female breast: Secondary | ICD-10-CM

## 2018-06-04 NOTE — Telephone Encounter (Signed)
Per 9/18 los, no new orders.

## 2018-06-05 ENCOUNTER — Telehealth: Payer: Self-pay | Admitting: *Deleted

## 2018-06-05 NOTE — Telephone Encounter (Signed)
Pt called and left vm stating she has decided not to have chemo and to choose alternate option of anti-estrogen oral therapy. Physician team notified. Left vm for pt to return call to discuss.

## 2018-06-08 NOTE — Patient Instructions (Signed)
Donna Gould  06/08/2018   Your procedure is scheduled on: 06/11/2018   Report to [facility] Main  Entrance  Report to admitting at    0545 AM    Call this number if you have problems the morning of surgery [phone]   Remember: Do not eat food or drink liquids :After Midnight. BRUSH YOUR TEETH MORNING OF SURGERY AND RINSE YOUR MOUTH OUT, NO CHEWING GUM CANDY OR MINTS.  NO SOLID FOOD AFTER MIDNIGHT THE NIGHT PRIOR TO SURGERY. NOTHING BY MOUTH EXCEPT CLEAR LIQUIDS UNTIL 3 HOURS PRIOR TO [place] SURGERY. PLEASE FINISH ENSURE DRINK PER SURGEON ORDER 3 HOURS PRIOR TO SCHEDULED SURGERY TIME WHICH NEEDS TO BE COMPLETED AT _____0445am _______.    CLEAR LIQUID DIET   Foods Allowed                                                                     Foods Excluded  Coffee and tea, regular and decaf                             liquids that you cannot  Plain Jell-O in any flavor                                             see through such as: Fruit ices (not with fruit pulp)                                     milk, soups, orange juice  Iced Popsicles                                    All solid food Carbonated beverages, regular and diet                                    Cranberry, grape and apple juices Sports drinks like Gatorade Lightly seasoned clear broth or consume(fat free) Sugar, honey syrup  Sample Menu Breakfast                                Lunch                                     Supper Cranberry juice                    Beef broth                            Chicken broth Jell-O  Grape juice                           Apple juice Coffee or tea                        Jell-O                                      Popsicle                                                Coffee or tea                        Coffee or tea  _____________________________________________________________________    Take these medicines  the morning of surgery with A SIP OF WATER: Oxycodone if needed                                 You may not have any metal on your body including hair pins and              piercings  Do not wear jewelry, make-up, lotions, powders or perfumes, deodorant             Do not wear nail polish.  Do not shave  48 hours prior to surgery.     Do not bring valuables to the hospital. [place].  Contacts, dentures or bridgework may not be worn into surgery.       Patients discharged the day of surgery will not be allowed to drive home.  Name and phone number of your driver:                Please read over the following fact sheets you were given: _____________________________________________________________________             [facility] - Preparing for Surgery Before surgery, you can play an important role.  Because skin is not sterile, your skin needs to be as free of germs as possible.  You can reduce the number of germs on your skin by washing with CHG (chlorahexidine gluconate) soap before surgery.  CHG is an antiseptic cleaner which kills germs and bonds with the skin to continue killing germs even after washing. Please DO NOT use if you have an allergy to CHG or antibacterial soaps.  If your skin becomes reddened/irritated stop using the CHG and inform your nurse when you arrive at Short Stay. Do not shave (including legs and underarms) for at least 48 hours prior to the first CHG shower.  You may shave your face/neck. Please follow these instructions carefully:  1.  Shower with CHG Soap the night before surgery and the  morning of Surgery.  2.  If you choose to wash your hair, wash your hair first as usual with your  normal  shampoo.  3.  After you shampoo, rinse your hair and body thoroughly to remove the  shampoo.  4.  Use CHG as you would any other liquid soap.  You can apply chg directly  to the skin and wash                        Gently with a scrungie or clean washcloth.  5.  Apply the CHG Soap to your body ONLY FROM THE NECK DOWN.   Do not use on face/ open                           Wound or open sores. Avoid contact with eyes, ears mouth and genitals (private parts).                       Wash face,  Genitals (private parts) with your normal soap.             6.  Wash thoroughly, paying special attention to the area where your surgery  will be performed.  7.  Thoroughly rinse your body with warm water from the neck down.  8.  DO NOT shower/wash with your normal soap after using and rinsing off  the CHG Soap.                9.  Pat yourself dry with a clean towel.            10.  Wear clean pajamas.            11.  Place clean sheets on your bed the night of your first shower and do not  sleep with pets. Day of Surgery : Do not apply any lotions/deodorants the morning of surgery.  Please wear clean clothes to the hospital/surgery center.  FAILURE TO FOLLOW THESE INSTRUCTIONS MAY RESULT IN THE CANCELLATION OF YOUR SURGERY PATIENT SIGNATURE_________________________________  NURSE SIGNATURE__________________________________  ________________________________________________________________________

## 2018-06-09 ENCOUNTER — Other Ambulatory Visit: Payer: BLUE CROSS/BLUE SHIELD

## 2018-06-09 ENCOUNTER — Encounter (HOSPITAL_COMMUNITY)
Admission: RE | Admit: 2018-06-09 | Discharge: 2018-06-09 | Disposition: A | Discharge status: Home / Self Care | Payer: BLUE CROSS/BLUE SHIELD | Source: Ambulatory Visit | Attending: General Surgery | Admitting: General Surgery

## 2018-06-09 ENCOUNTER — Encounter (HOSPITAL_COMMUNITY): Payer: Self-pay

## 2018-06-09 ENCOUNTER — Other Ambulatory Visit: Payer: Self-pay

## 2018-06-09 ENCOUNTER — Telehealth: Payer: Self-pay | Admitting: *Deleted

## 2018-06-09 ENCOUNTER — Other Ambulatory Visit (HOSPITAL_COMMUNITY): Payer: BLUE CROSS/BLUE SHIELD

## 2018-06-09 DIAGNOSIS — C50911 Malignant neoplasm of unspecified site of right female breast: Secondary | ICD-10-CM | POA: Diagnosis not present

## 2018-06-09 DIAGNOSIS — Z01812 Encounter for preprocedural laboratory examination: Secondary | ICD-10-CM | POA: Diagnosis not present

## 2018-06-09 HISTORY — DX: Prediabetes: R73.03

## 2018-06-09 HISTORY — DX: Malignant neoplasm of unspecified site of unspecified female breast: C50.919

## 2018-06-09 LAB — BASIC METABOLIC PANEL
ANION GAP: 9 (ref 5–15)
BUN: 16 mg/dL (ref 6–20)
CALCIUM: 9.2 mg/dL (ref 8.9–10.3)
CHLORIDE: 102 mmol/L (ref 98–111)
CO2: 33 mmol/L — AB (ref 22–32)
Creatinine, Ser: 1.08 mg/dL — ABNORMAL HIGH (ref 0.44–1.00)
GFR calc non Af Amer: 57 mL/min — ABNORMAL LOW (ref >60)
Glucose, Bld: 103 mg/dL — ABNORMAL HIGH (ref 70–99)
POTASSIUM: 3.4 mmol/L — AB (ref 3.5–5.1)
Sodium: 144 mmol/L (ref 135–145)

## 2018-06-09 LAB — CBC
HEMATOCRIT: 36.2 % (ref 36.0–46.0)
Hemoglobin: 12 g/dL (ref 12.0–15.0)
MCH: 26.4 pg (ref 26.0–34.0)
MCHC: 33.1 g/dL (ref 30.0–36.0)
MCV: 79.6 fL (ref 78.0–100.0)
Platelets: 313 10*3/uL (ref 150–400)
RBC: 4.55 MIL/uL (ref 3.87–5.11)
RDW: 14.6 % (ref 11.5–15.5)
WBC: 4.8 10*3/uL (ref 4.0–10.5)

## 2018-06-09 LAB — HEMOGLOBIN A1C
HEMOGLOBIN A1C: 6.1 % — AB (ref 4.8–5.6)
Mean Plasma Glucose: 128.37 mg/dL

## 2018-06-09 NOTE — Telephone Encounter (Signed)
Spoke to pt regarding location for xrt. Pt wishes to have xrt in [place]. Referral sent.

## 2018-06-09 NOTE — Patient Instructions (Addendum)
Your procedure is scheduled on: Thursday, Sept. 26, 2019   Surgery Time:  7:45AM-8:45AM   Report to [place]  Entrance    Report to admitting at 5:45 AM   Call this number if you have problems the morning of surgery [phone]   Do not eat food or drink liquids :After Midnight.   Brush your teeth the morning of surgery.   Do NOT smoke after Midnight   Complete one Ensure drink the morning of surgery by  4:30AM the day of surgery.   Take these medicines the morning of surgery with A SIP OF WATER: None                               You may not have any metal on your body including hair pins, jewelry, and body piercings             Do not wear make-up, lotions, powders, perfumes/cologne, or deodorant             Do not wear nail polish.  Do not shave  48 hours prior to surgery.               Do not bring valuables to the hospital. [place].   Contacts, dentures or bridgework may not be worn into surgery.    Patients discharged the day of surgery will not be allowed to drive home.   Special Instructions: Bring a copy of your healthcare power of attorney and living will documents         the day of surgery if you haven't scanned them in before.              Please read over the following fact sheets you were given:  [facility] - Preparing for Surgery Before surgery, you can play an important role.  Because skin is not sterile, your skin needs to be as free of germs as possible.  You can reduce the number of germs on your skin by washing with CHG (chlorahexidine gluconate) soap before surgery.  CHG is an antiseptic cleaner which kills germs and bonds with the skin to continue killing germs even after washing. Please DO NOT use if you have an allergy to CHG or antibacterial soaps.  If your skin becomes reddened/irritated stop using the CHG and inform your nurse when you arrive at Short Stay. Do not shave  (including legs and underarms) for at least 48 hours prior to the first CHG shower.  You may shave your face/neck.  Please follow these instructions carefully:  1.  Shower with CHG Soap the night before surgery and the  morning of surgery.  2.  If you choose to wash your hair, wash your hair first as usual with your normal  shampoo.  3.  After you shampoo, rinse your hair and body thoroughly to remove the shampoo.                             4.  Use CHG as you would any other liquid soap.  You can apply chg directly to the skin and wash.  Gently with a scrungie or clean washcloth.  5.  Apply the CHG Soap to your body ONLY FROM THE NECK DOWN.   Do  not use on face/ open                           Wound or open sores. Avoid contact with eyes, ears mouth and   genitals (private parts).                       Wash face,  Genitals (private parts) with your normal soap.             6.  Wash thoroughly, paying special attention to the area where your    surgery  will be performed.  7.  Thoroughly rinse your body with warm water from the neck down.  8.  DO NOT shower/wash with your normal soap after using and rinsing off the CHG Soap.                9.  Pat yourself dry with a clean towel.            10.  Wear clean pajamas.            11.  Place clean sheets on your bed the night of your first shower and do not  sleep with pets. Day of Surgery : Do not apply any lotions/deodorants the morning of surgery.  Please wear clean clothes to the hospital/surgery center.  FAILURE TO FOLLOW THESE INSTRUCTIONS MAY RESULT IN THE CANCELLATION OF YOUR SURGERY  PATIENT SIGNATURE_________________________________  NURSE SIGNATURE__________________________________  ________________________________________________________________________

## 2018-06-10 ENCOUNTER — Inpatient Hospital Stay (HOSPITAL_COMMUNITY)
Admission: RE | Admit: 2018-06-10 | Discharge: 2018-06-10 | Disposition: A | Discharge status: Home / Self Care | Payer: BLUE CROSS/BLUE SHIELD | Source: Ambulatory Visit

## 2018-06-10 NOTE — Anesthesia Preprocedure Evaluation (Addendum)
Anesthesia Evaluation  Patient identified by MRN, date of birth, ID band Patient awake    Reviewed: Allergy & Precautions, NPO status , Patient's Chart, lab work & pertinent test results  Airway Mallampati: II  TM Distance: >3 FB Neck ROM: Full    Dental no notable dental hx. (+) Dental Advisory Given   Pulmonary neg pulmonary ROS,    Pulmonary exam normal        Cardiovascular hypertension, Pt. on medications Normal cardiovascular exam     Neuro/Psych negative neurological ROS     GI/Hepatic negative GI ROS, Neg liver ROS,   Endo/Other  negative endocrine ROS  Renal/GU negative Renal ROS     Musculoskeletal negative musculoskeletal ROS (+)   Abdominal   Peds  Hematology negative hematology ROS (+)   Anesthesia Other Findings Day of surgery medications reviewed with the patient.  Reproductive/Obstetrics                            Anesthesia Physical Anesthesia Plan  ASA: II  Anesthesia Plan: General   Post-op Pain Management:    Induction: Intravenous  PONV Risk Score and Plan: 3 and Ondansetron, Dexamethasone and Diphenhydramine  Airway Management Planned: LMA  Additional Equipment:   Intra-op Plan:   Post-operative Plan: Extubation in OR  Informed Consent: I have reviewed the patients History and Physical, chart, labs and discussed the procedure including the risks, benefits and alternatives for the proposed anesthesia with the patient or authorized representative who has indicated his/her understanding and acceptance.   Dental advisory given  Plan Discussed with: CRNA and Anesthesiologist  Anesthesia Plan Comments:        Anesthesia Quick Evaluation

## 2018-06-11 ENCOUNTER — Ambulatory Visit (HOSPITAL_COMMUNITY)
Admission: RE | Admit: 2018-06-11 | Discharge: 2018-06-11 | Disposition: A | Discharge status: Home / Self Care | Payer: BLUE CROSS/BLUE SHIELD | Source: Ambulatory Visit | Attending: General Surgery | Admitting: General Surgery

## 2018-06-11 ENCOUNTER — Encounter (HOSPITAL_COMMUNITY)
Admission: RE | Disposition: A | Discharge status: Home / Self Care | Payer: Self-pay | Source: Ambulatory Visit | Attending: General Surgery

## 2018-06-11 ENCOUNTER — Ambulatory Visit (HOSPITAL_COMMUNITY): Payer: BLUE CROSS/BLUE SHIELD | Admitting: Anesthesiology

## 2018-06-11 ENCOUNTER — Encounter (HOSPITAL_COMMUNITY): Payer: Self-pay | Admitting: Emergency Medicine

## 2018-06-11 DIAGNOSIS — F419 Anxiety disorder, unspecified: Secondary | ICD-10-CM | POA: Diagnosis not present

## 2018-06-11 DIAGNOSIS — Z170 Estrogen receptor positive status [ER+]: Secondary | ICD-10-CM | POA: Insufficient documentation

## 2018-06-11 DIAGNOSIS — Z803 Family history of malignant neoplasm of breast: Secondary | ICD-10-CM | POA: Insufficient documentation

## 2018-06-11 DIAGNOSIS — D0511 Intraductal carcinoma in situ of right breast: Secondary | ICD-10-CM | POA: Insufficient documentation

## 2018-06-11 DIAGNOSIS — Z79899 Other long term (current) drug therapy: Secondary | ICD-10-CM | POA: Diagnosis not present

## 2018-06-11 DIAGNOSIS — C50911 Malignant neoplasm of unspecified site of right female breast: Secondary | ICD-10-CM | POA: Diagnosis present

## 2018-06-11 DIAGNOSIS — I10 Essential (primary) hypertension: Secondary | ICD-10-CM | POA: Diagnosis not present

## 2018-06-11 HISTORY — PX: RE-EXCISION OF BREAST LUMPECTOMY: SHX6048

## 2018-06-11 LAB — PREGNANCY, URINE: PREG TEST UR: NEGATIVE

## 2018-06-11 SURGERY — EXCISION, LESION, BREAST
Anesthesia: General | Site: Breast | Laterality: Right

## 2018-06-11 MED ORDER — LIDOCAINE 2% (20 MG/ML) 5 ML SYRINGE
INTRAMUSCULAR | Status: DC | PRN
  Administered 2018-06-11: 100 mg via INTRAVENOUS

## 2018-06-11 MED ORDER — LACTATED RINGERS IV SOLN
INTRAVENOUS | Status: DC | PRN
  Administered 2018-06-11: 07:00:00 via INTRAVENOUS

## 2018-06-11 MED ORDER — BUPIVACAINE-EPINEPHRINE 0.25% -1:200000 IJ SOLN
INTRAMUSCULAR | Status: DC | PRN
  Administered 2018-06-11: 20 mL

## 2018-06-11 MED ORDER — PROMETHAZINE HCL 25 MG/ML IJ SOLN: 6.2500 mg | INTRAMUSCULAR | Status: DC | PRN

## 2018-06-11 MED ORDER — FENTANYL CITRATE (PF) 100 MCG/2ML IJ SOLN: 25.0000 ug | INTRAMUSCULAR | Status: DC | PRN

## 2018-06-11 MED ORDER — CHLORHEXIDINE GLUCONATE CLOTH 2 % EX PADS: 6.0000 | MEDICATED_PAD | Freq: Once | CUTANEOUS | Status: DC

## 2018-06-11 MED ORDER — BUPIVACAINE-EPINEPHRINE (PF) 0.25% -1:200000 IJ SOLN
INTRAMUSCULAR | Status: AC
  Filled 2018-06-11: qty 30

## 2018-06-11 MED ORDER — EPHEDRINE 5 MG/ML INJ
INTRAVENOUS | Status: AC
  Filled 2018-06-11: qty 10

## 2018-06-11 MED ORDER — ONDANSETRON HCL 4 MG/2ML IJ SOLN
INTRAMUSCULAR | Status: DC | PRN
  Administered 2018-06-11: 4 mg via INTRAVENOUS

## 2018-06-11 MED ORDER — LIDOCAINE 2% (20 MG/ML) 5 ML SYRINGE
INTRAMUSCULAR | Status: AC
  Filled 2018-06-11: qty 5

## 2018-06-11 MED ORDER — EPHEDRINE SULFATE-NACL 50-0.9 MG/10ML-% IV SOSY
PREFILLED_SYRINGE | INTRAVENOUS | Status: DC | PRN
  Administered 2018-06-11 (×2): 5 mg via INTRAVENOUS

## 2018-06-11 MED ORDER — DIPHENHYDRAMINE HCL 50 MG/ML IJ SOLN
INTRAMUSCULAR | Status: AC
  Filled 2018-06-11: qty 1

## 2018-06-11 MED ORDER — DEXAMETHASONE SODIUM PHOSPHATE 10 MG/ML IJ SOLN
INTRAMUSCULAR | Status: DC | PRN
  Administered 2018-06-11: 10 mg via INTRAVENOUS

## 2018-06-11 MED ORDER — DEXAMETHASONE SODIUM PHOSPHATE 10 MG/ML IJ SOLN
INTRAMUSCULAR | Status: AC
  Filled 2018-06-11: qty 1

## 2018-06-11 MED ORDER — MIDAZOLAM HCL 5 MG/5ML IJ SOLN
INTRAMUSCULAR | Status: DC | PRN
  Administered 2018-06-11: 2 mg via INTRAVENOUS

## 2018-06-11 MED ORDER — FENTANYL CITRATE (PF) 100 MCG/2ML IJ SOLN
INTRAMUSCULAR | Status: DC | PRN
  Administered 2018-06-11 (×2): 50 ug via INTRAVENOUS

## 2018-06-11 MED ORDER — LIDOCAINE HCL (PF) 1 % IJ SOLN
INTRAMUSCULAR | Status: AC
  Filled 2018-06-11: qty 30

## 2018-06-11 MED ORDER — OXYCODONE HCL 5 MG PO TABS
5.0000 mg | ORAL_TABLET | Freq: Once | ORAL | Status: AC | PRN
  Administered 2018-06-11: 5 mg via ORAL

## 2018-06-11 MED ORDER — ACETAMINOPHEN 500 MG PO TABS
ORAL_TABLET | ORAL | Status: AC
  Administered 2018-06-11: 1000 mg via ORAL
  Filled 2018-06-11: qty 2

## 2018-06-11 MED ORDER — OXYCODONE HCL 5 MG PO TABS
ORAL_TABLET | ORAL | Status: AC
  Filled 2018-06-11: qty 1

## 2018-06-11 MED ORDER — LIDOCAINE HCL 1 % IJ SOLN
INTRAMUSCULAR | Status: DC | PRN
  Administered 2018-06-11: 20 mL

## 2018-06-11 MED ORDER — DIPHENHYDRAMINE HCL 50 MG/ML IJ SOLN
INTRAMUSCULAR | Status: DC | PRN
  Administered 2018-06-11: 12.5 mg via INTRAVENOUS

## 2018-06-11 MED ORDER — CEFAZOLIN SODIUM-DEXTROSE 2-4 GM/100ML-% IV SOLN
2.0000 g | INTRAVENOUS | Status: AC
  Administered 2018-06-11: 2 g via INTRAVENOUS
  Filled 2018-06-11: qty 100

## 2018-06-11 MED ORDER — FENTANYL CITRATE (PF) 100 MCG/2ML IJ SOLN
INTRAMUSCULAR | Status: AC
  Filled 2018-06-11: qty 2

## 2018-06-11 MED ORDER — PROPOFOL 10 MG/ML IV BOLUS
INTRAVENOUS | Status: AC
  Filled 2018-06-11: qty 20

## 2018-06-11 MED ORDER — PROPOFOL 10 MG/ML IV BOLUS
INTRAVENOUS | Status: DC | PRN
  Administered 2018-06-11: 150 mg via INTRAVENOUS

## 2018-06-11 MED ORDER — ACETAMINOPHEN 500 MG PO TABS
1000.0000 mg | ORAL_TABLET | ORAL | Status: AC
  Administered 2018-06-11: 1000 mg via ORAL

## 2018-06-11 MED ORDER — GABAPENTIN 300 MG PO CAPS
300.0000 mg | ORAL_CAPSULE | ORAL | Status: AC
  Administered 2018-06-11: 300 mg via ORAL

## 2018-06-11 MED ORDER — ONDANSETRON HCL 4 MG/2ML IJ SOLN
INTRAMUSCULAR | Status: AC
  Filled 2018-06-11: qty 2

## 2018-06-11 MED ORDER — MIDAZOLAM HCL 2 MG/2ML IJ SOLN
INTRAMUSCULAR | Status: AC
  Filled 2018-06-11: qty 2

## 2018-06-11 MED ORDER — GABAPENTIN 300 MG PO CAPS
ORAL_CAPSULE | ORAL | Status: AC
  Administered 2018-06-11: 300 mg via ORAL
  Filled 2018-06-11: qty 1

## 2018-06-11 SURGICAL SUPPLY — 34 items
BENZOIN TINCTURE PRP APPL 2/3 (GAUZE/BANDAGES/DRESSINGS) ×3 IMPLANT
BINDER BREAST LRG (GAUZE/BANDAGES/DRESSINGS) IMPLANT
BINDER BREAST XLRG (GAUZE/BANDAGES/DRESSINGS) IMPLANT
BLADE HEX COATED 2.75 (ELECTRODE) ×3 IMPLANT
BLADE SURG 15 STRL LF DISP TIS (BLADE) ×1 IMPLANT
BLADE SURG 15 STRL SS (BLADE) ×2
CHLORAPREP W/TINT 26ML (MISCELLANEOUS) ×3 IMPLANT
CLIP VESOCCLUDE LG 6/CT (CLIP) ×3 IMPLANT
CLOSURE WOUND 1/2 X4 (GAUZE/BANDAGES/DRESSINGS) ×1
DECANTER SPIKE VIAL GLASS SM (MISCELLANEOUS) ×3 IMPLANT
DRAPE LAPAROSCOPIC ABDOMINAL (DRAPES) ×3 IMPLANT
DRSG PAD ABDOMINAL 8X10 ST (GAUZE/BANDAGES/DRESSINGS) ×3 IMPLANT
ELECT PENCIL ROCKER SW 15FT (MISCELLANEOUS) ×3 IMPLANT
ELECT REM PT RETURN 15FT ADLT (MISCELLANEOUS) ×3 IMPLANT
GAUZE SPONGE 4X4 12PLY STRL (GAUZE/BANDAGES/DRESSINGS) ×3 IMPLANT
GLOVE BIO SURGEON STRL SZ 6 (GLOVE) ×3 IMPLANT
GLOVE INDICATOR 6.5 STRL GRN (GLOVE) ×3 IMPLANT
GOWN STRL REUS W/TWL 2XL LVL3 (GOWN DISPOSABLE) ×3 IMPLANT
GOWN STRL REUS W/TWL XL LVL3 (GOWN DISPOSABLE) ×3 IMPLANT
KIT BASIN OR (CUSTOM PROCEDURE TRAY) ×3 IMPLANT
KIT MARKER MARGIN INK (KITS) ×3 IMPLANT
MARKER SKIN DUAL TIP RULER LAB (MISCELLANEOUS) ×3 IMPLANT
NEEDLE HYPO 22GX1.5 SAFETY (NEEDLE) ×3 IMPLANT
PACK BASIC VI WITH GOWN DISP (CUSTOM PROCEDURE TRAY) ×3 IMPLANT
SPONGE LAP 18X18 RF (DISPOSABLE) ×3 IMPLANT
STRIP CLOSURE SKIN 1/2X4 (GAUZE/BANDAGES/DRESSINGS) ×2 IMPLANT
SUT MNCRL AB 4-0 PS2 18 (SUTURE) ×3 IMPLANT
SUT VIC AB 3-0 PS2 18 (SUTURE)
SUT VIC AB 3-0 PS2 18XBRD (SUTURE) IMPLANT
SUT VIC AB 3-0 SH 27 (SUTURE) ×2
SUT VIC AB 3-0 SH 27X BRD (SUTURE) ×1 IMPLANT
SYR CONTROL 10ML LL (SYRINGE) ×3 IMPLANT
TOWEL OR 17X26 10 PK STRL BLUE (TOWEL DISPOSABLE) ×3 IMPLANT
TOWEL OR NON WOVEN STRL DISP B (DISPOSABLE) ×3 IMPLANT

## 2018-06-11 NOTE — Op Note (Signed)
Re-excisional Right Breast Lumpectomy   Indications: This patient presents with history of positive margins after lumpectomy for right breast cancer   Pre-operative Diagnosis: right breast cancer   Post-operative Diagnosis: Right breast cancer   Surgeon: [name]   Assistants: n/a   Anesthesia: General anesthesia and Local anesthesia  ASA Class: 2   Procedure Details  The patient was seen in the Holding Room. The risks, benefits, complications, treatment options, and expected outcomes were discussed with the patient. The possibilities of reaction to medication, pulmonary aspiration, bleeding, infection, the need for additional procedures, failure to diagnose a condition, and creating a complication requiring transfusion or operation were discussed with the patient. The patient concurred with the proposed plan, giving informed consent. The site of surgery properly noted/marked. The patient was taken to Operating Room # 4, identified, and the procedure verified as re-excision of right breast cancer.  After induction of anesthesia, the right breast and chest were prepped and draped in standard fashion.  The lumpectomy was performed by reopening the prior incision. Seroma was aspirated. The mastopexy sutures were removed. Additional margins were taken at the lateral border of the partial mastectomy cavity. Dissection was carried down to the pectoral fascia. Orientation sutures were placed in the specimens. Hemostasis was achieved with cautery. The wound was irrigated and closed with a 3-0 Vicryl deep dermal interrupted and a 4-0 Monocryl subcuticular closure in layers.  Sterile dressings were applied. At the end of the operation, all sponge, instrument, and needle counts were correct.   Findings:  grossly clear surgical margins, no significant seroma.  Estimated Blood Loss: Minimal   Drains: none   Specimens: additional lateral margins.   Complications: None; patient tolerated the  procedure well.   Disposition: PACU - hemodynamically stable.   Condition: stable

## 2018-06-11 NOTE — Anesthesia Procedure Notes (Signed)
Procedure Name: LMA Insertion Date/Time: 06/11/2018 7:58 AM Performed by: Maxwell Caul, CRNA Pre-anesthesia Checklist: Patient identified, Emergency Drugs available, Suction available and Patient being monitored Patient Re-evaluated:Patient Re-evaluated prior to induction Oxygen Delivery Method: Circle system utilized Preoxygenation: Pre-oxygenation with 100% oxygen Induction Type: IV induction LMA: LMA inserted LMA Size: 4.0 Number of attempts: 1 Placement Confirmation: positive ETCO2 and breath sounds checked- equal and bilateral Tube secured with: Tape Dental Injury: Teeth and Oropharynx as per pre-operative assessment

## 2018-06-11 NOTE — Interval H&P Note (Signed)
History and Physical Interval Note:  06/11/2018 7:41 AM  Donna Gould  has presented today for surgery, with the diagnosis of RIGHT BREAST CANCER  The various methods of treatment have been discussed with the patient and family. After consideration of risks, benefits and other options for treatment, the patient has consented to  Procedure(s): RE-EXCISION OF BREAST LUMPECTOMY (Right) as a surgical intervention .  The patient's history has been reviewed, patient examined, no change in status, stable for surgery.  I have reviewed the patient's chart and labs.  Questions were answered to the patient's satisfaction.     [name]

## 2018-06-11 NOTE — Anesthesia Postprocedure Evaluation (Signed)
Anesthesia Post Note  Patient: Donna Gould  Procedure(s) Performed: RE-EXCISION OF BREAST LUMPECTOMY (Right Breast)     Patient location during evaluation: PACU Anesthesia Type: General Level of consciousness: sedated Pain management: pain level controlled Vital Signs Assessment: post-procedure vital signs reviewed and stable Respiratory status: spontaneous breathing and respiratory function stable Cardiovascular status: stable Postop Assessment: no apparent nausea or vomiting Anesthetic complications: no    Last Vitals:  Vitals:   06/11/18 0930 06/11/18 0943  BP: 133/71 (!) 150/83  Pulse: 71 78  Resp:    Temp: 36.5 C 36.6 C  SpO2: 96% 98%    Last Pain:  Vitals:   06/11/18 0945  TempSrc:   PainSc: 5                  Brahm Barbeau DANIEL

## 2018-06-11 NOTE — Transfer of Care (Signed)
Immediate Anesthesia Transfer of Care Note  Patient: Donna Gould  Procedure(s) Performed: RE-EXCISION OF BREAST LUMPECTOMY (Right Breast)  Patient Location: PACU  Anesthesia Type:General  Level of Consciousness: awake, alert , oriented and patient cooperative  Airway & Oxygen Therapy: Patient Spontanous Breathing and Patient connected to face mask oxygen  Post-op Assessment: Report given to RN and Post -op Vital signs reviewed and stable  Post vital signs: Reviewed and stable  Last Vitals:  Vitals Value Taken Time  BP 138/75 06/11/2018  8:57 AM  Temp    Pulse 87 06/11/2018  8:58 AM  Resp    SpO2 100 % 06/11/2018  8:58 AM  Vitals shown include unvalidated device data.  Last Pain:  Vitals:   06/11/18 0604  TempSrc:   PainSc: 0-No pain      Patients Stated Pain Goal: 4 (03/75/43 6067)  Complications: No apparent anesthesia complications

## 2018-06-11 NOTE — Discharge Instructions (Addendum)
[facility] °Office Phone Number [phone] ° °BREAST BIOPSY/ PARTIAL MASTECTOMY: POST OP INSTRUCTIONS ° °Always review your discharge instruction sheet given to you by the facility where your surgery was performed. ° °IF YOU HAVE DISABILITY OR FAMILY LEAVE FORMS, YOU MUST BRING THEM TO THE OFFICE FOR PROCESSING.  DO NOT GIVE THEM TO YOUR DOCTOR. ° °1. A prescription for pain medication may be given to you upon discharge.  Take your pain medication as prescribed, if needed.  If narcotic pain medicine is not needed, then you may take acetaminophen (Tylenol) or ibuprofen (Advil) as needed. °2. Take your usually prescribed medications unless otherwise directed °3. If you need a refill on your pain medication, please contact your pharmacy.  They will contact our office to request authorization.  Prescriptions will not be filled after 5pm or on week-ends. °4. You should eat very light the first 24 hours after surgery, such as soup, crackers, pudding, etc.  Resume your normal diet the day after surgery. °5. Most patients will experience some swelling and bruising in the breast.  Ice packs and a good support bra will help.  Swelling and bruising can take several days to resolve.  °6. It is common to experience some constipation if taking pain medication after surgery.  Increasing fluid intake and taking a stool softener will usually help or prevent this problem from occurring.  A mild laxative (Milk of Magnesia or Miralax) should be taken according to package directions if there are no bowel movements after 48 hours. °7. Unless discharge instructions indicate otherwise, you may remove your bandages 48 hours after surgery, and you may shower at that time.  You may have steri-strips (small skin tapes) in place directly over the incision.  These strips should be left on the skin for 7-10 days.   Any sutures or staples will be removed at the office during your follow-up visit. °8. ACTIVITIES:  You may resume  regular daily activities (gradually increasing) beginning the next day.  Wearing a good support bra or sports bra (or the breast binder) minimizes pain and swelling.  You may have sexual intercourse when it is comfortable. °a. You may drive when you no longer are taking prescription pain medication, you can comfortably wear a seatbelt, and you can safely maneuver your car and apply brakes. °b. RETURN TO WORK:  __________1 week_______________ °9. You should see your doctor in the office for a follow-up appointment approximately two weeks after your surgery.  Your doctor’s nurse will typically make your follow-up appointment when she calls you with your pathology report.  Expect your pathology report 2-3 business days after your surgery.  You may call to check if you do not hear from us after three days. ° ° °WHEN TO CALL YOUR DOCTOR: °1. Fever over 101.0 °2. Nausea and/or vomiting. °3. Extreme swelling or bruising. °4. Continued bleeding from incision. °5. Increased pain, redness, or drainage from the incision. ° °The clinic staff is available to answer your questions during regular business hours.  Please don’t hesitate to call and ask to speak to one of the nurses for clinical concerns.  If you have a medical emergency, go to the nearest emergency room or call 911.  A surgeon from [facility] is always on call at the hospital. ° °For further questions, please visit [email or web address]  ° °

## 2018-06-12 ENCOUNTER — Encounter (HOSPITAL_COMMUNITY): Payer: Self-pay | Admitting: General Surgery

## 2018-06-12 NOTE — Progress Notes (Signed)
Margin is negative!.  Please let patient know.

## 2018-08-19 ENCOUNTER — Telehealth: Payer: Self-pay | Admitting: *Deleted

## 2018-08-19 NOTE — Telephone Encounter (Signed)
Pt called and request earlier time to see Dr. Lindi Adie. Scheduled and confirmed appt time for 1100.

## 2018-08-19 NOTE — Telephone Encounter (Signed)
Left vm for pt to return call regarding final xrt in [place]. Pt left vm stating last tx will be on 08/20/18. Called pt back and left vm with return appt to see Dr. Lindi Adie on 08/24/18 at 3:30. Request return call regarding appt.

## 2018-08-24 ENCOUNTER — Telehealth: Payer: Self-pay | Admitting: Hematology and Oncology

## 2018-08-24 ENCOUNTER — Ambulatory Visit: Payer: BLUE CROSS/BLUE SHIELD | Admitting: Hematology and Oncology

## 2018-08-24 ENCOUNTER — Inpatient Hospital Stay: Payer: BLUE CROSS/BLUE SHIELD | Attending: Hematology and Oncology | Admitting: Hematology and Oncology

## 2018-08-24 DIAGNOSIS — Z79810 Long term (current) use of selective estrogen receptor modulators (SERMs): Secondary | ICD-10-CM | POA: Insufficient documentation

## 2018-08-24 DIAGNOSIS — C50411 Malignant neoplasm of upper-outer quadrant of right female breast: Secondary | ICD-10-CM

## 2018-08-24 DIAGNOSIS — Z170 Estrogen receptor positive status [ER+]: Secondary | ICD-10-CM | POA: Diagnosis not present

## 2018-08-24 DIAGNOSIS — Z923 Personal history of irradiation: Secondary | ICD-10-CM | POA: Diagnosis not present

## 2018-08-24 NOTE — Progress Notes (Signed)
Patient Care Team: Andres Shad, MD as PCP - General (Family Medicine)  DIAGNOSIS:  Encounter Diagnosis  Name Primary?  . Malignant neoplasm of upper-outer quadrant of right breast in female, estrogen receptor positive ([place])     SUMMARY OF ONCOLOGIC HISTORY:   Malignant neoplasm of upper-outer quadrant of right breast in female, estrogen receptor positive ([place])   03/13/2018 Oncotype testing    Oncotype DX recurrence score 31, 19% risk of recurrence with Oncotype alone and greater than 15% absolute reduction in chemotherapy benefit    04/08/2018 Initial Diagnosis    Screening detected right breast mass at 9:30 position measuring 9 mm, biopsy revealed IDC intermediate grade, ER 50%, PR negative HER-2 negative, Ki-67 40%, T1b N0 stage Ia     Genetic Testing    Negative genetic testing on the [facility] Breast Cancer STAT + Common Hereditary Cancers Panel. The STAT Breast cancer panel offered by [facility] includes sequencing and rearrangement analysis for the following 9 genes:  ATM, BRCA1, BRCA2, CDH1, CHEK2, PALB2, PTEN, STK11 and TP53.  The Common Hereditary Cancers Panel offered by [facility] includes sequencing and/or deletion duplication testing of the following 47 genes: APC, ATM, AXIN2, BARD1, BMPR1A, BRCA1, BRCA2, BRIP1, CDH1, CDKN2A (p14ARF), CDKN2A (p16INK4a), CKD4, CHEK2, CTNNA1, DICER1, EPCAM (Deletion/duplication testing only), GREM1 (promoter region deletion/duplication testing only), KIT, MEN1, MLH1, MSH2, MSH3, MSH6, MUTYH, NBN, NF1, NHTL1, PALB2, PDGFRA, PMS2, POLD1, POLE, PTEN, RAD50, RAD51C, RAD51D, SDHB, SDHC, SDHD, SMAD4, SMARCA4. STK11, TP53, TSC1, TSC2, and VHL.  The following genes were evaluated for sequence changes only: SDHA and HOXB13 c.251G>A variant only.  The report date is 05/06/18    05/13/2018 Surgery    Right lumpectomy: IDC with DCIS, 1.2 cm, focally involves lateral margin, 0/4 lymph nodes negative, ER 50%, PR 0%, HER-2 negative, Ki-67 40%, T1 CN 0 stage  Ia    05/27/2018 Cancer Staging    Staging form: Breast, AJCC 8th Edition - Pathologic: Stage IA (pT1c, pN0, cM0, G2, ER+, PR-, HER2-) - Signed by Gardenia Phlegm, NP on 05/27/2018    06/11/2018 Surgery    Right breast reexcision of lateral margin: DCIS no invasive cancer, 0.1 cm from final lateral margin.    06/11/2018 Oncotype testing    Oncotype DX 31: 19% risk of recurrence, patient refused chemotherapy    07/13/2018 - 08/20/2018 Radiation Therapy    Adjuvant radiation therapy at [facility]    08/24/2018 -  Anti-estrogen oral therapy    Tamoxifen 20 mg daily (patient's last menstrual cycle July 2019)     CHIEF COMPLIANT: Follow-up after completion of radiation therapy at [facility]: Donna Gould is a 57 year old with above-mentioned history of right breast cancer who finished adjuvant radiation.  She is here today to discuss starting antiestrogen therapy.  She had a high risk Oncotype score of 31 and but refused adjuvant chemotherapy.  She has skin excoriation and radiation dermatitis.  REVIEW OF SYSTEMS:   Constitutional: Denies fevers, chills or abnormal weight loss Eyes: Denies blurriness of vision Ears, nose, mouth, throat, and face: Denies mucositis or sore throat Respiratory: Denies cough, dyspnea or wheezes Cardiovascular: Denies palpitation, chest discomfort Gastrointestinal:  Denies nausea, heartburn or change in bowel habits Skin: Denies abnormal skin rashes Lymphatics: Denies new lymphadenopathy or easy bruising Neurological:Denies numbness, tingling or new weaknesses Behavioral/Psych: Mood is stable, no new changes  Extremities: No lower extremity edema Breast: Radiation dermatitis All other systems were reviewed with the patient and are negative.  I have reviewed the  past medical history, past surgical history, social history and family history with the patient and they are unchanged from previous note.  ALLERGIES:  has No Known  Allergies.  MEDICATIONS:  Current Outpatient Medications  Medication Sig Dispense Refill  . lisinopril-hydrochlorothiazide (PRINZIDE,ZESTORETIC) 20-25 MG tablet Take 1 tablet by mouth every morning.  3   No current facility-administered medications for this visit.     PHYSICAL EXAMINATION: ECOG PERFORMANCE STATUS: 1 - Symptomatic but completely ambulatory  Vitals:   08/24/18 1056  BP: (!) 116/92  Pulse: 67  Resp: 18  Temp: 97.7 F (36.5 C)  SpO2: 100%   Filed Weights   08/24/18 1056  Weight: 243 lb 6.4 oz (110.4 kg)    GENERAL:alert, no distress and comfortable SKIN: skin color, texture, turgor are normal, no rashes or significant lesions EYES: normal, Conjunctiva are pink and non-injected, sclera clear OROPHARYNX:no exudate, no erythema and lips, buccal mucosa, and tongue normal  NECK: supple, thyroid normal size, non-tender, without nodularity LYMPH:  no palpable lymphadenopathy in the cervical, axillary or inguinal LUNGS: clear to auscultation and percussion with normal breathing effort HEART: regular rate & rhythm and no murmurs and no lower extremity edema ABDOMEN:abdomen soft, non-tender and normal bowel sounds MUSCULOSKELETAL:no cyanosis of digits and no clubbing  NEURO: alert & oriented x 3 with fluent speech, no focal motor/sensory deficits EXTREMITIES: No lower extremity edema   LABORATORY DATA:  I have reviewed the data as listed CMP Latest Ref Rng & Units 06/09/2018 05/12/2018  Glucose 70 - 99 mg/dL 103(H) 122(H)  BUN 6 - 20 mg/dL 16 10  Creatinine 0.44 - 1.00 mg/dL 1.08(H) 1.17(H)  Sodium 135 - 145 mmol/L 144 139  Potassium 3.5 - 5.1 mmol/L 3.4(L) 3.7  Chloride 98 - 111 mmol/L 102 102  CO2 22 - 32 mmol/L 33(H) 27  Calcium 8.9 - 10.3 mg/dL 9.2 8.8(L)  Total Protein 6.5 - 8.1 g/dL - 7.8  Total Bilirubin 0.3 - 1.2 mg/dL - 1.0  Alkaline Phos 38 - 126 U/L - 45  AST 15 - 41 U/L - 20  ALT 0 - 44 U/L - 16    Lab Results  Component Value Date   WBC 4.8  06/09/2018   HGB 12.0 06/09/2018   HCT 36.2 06/09/2018   MCV 79.6 06/09/2018   PLT 313 06/09/2018   NEUTROABS 2.1 05/12/2018    ASSESSMENT & PLAN:  Malignant neoplasm of upper-outer quadrant of right breast in female, estrogen receptor positive ([place]) 05/13/2018:Right lumpectomy: IDC with DCIS, 1.2 cm, focally involves lateral margin, 0/4 lymph nodes negative, ER 50%, PR 0%, HER-2 negative, Ki-67 40%, T1 CN 0 stage Ia Oncotype DX recurrence score 31: 19% risk of distant recurrence of 9 years with hormone therapy alone Chemo benefit greater than 15% 06/11/2018: Reexcision for lateral margin: DCIS  Recommendation: 1.    Patient refused chemotherapy  2.  Adjuvant radiation therapy completed 08/19/2018 3.  Followed by adjuvant antiestrogen therapy started 08/24/2018 ---------------------------------------------------------------- Current treatment: Adjuvant antiestrogen therapy with tamoxifen 20 mg daily, may switch to aromatase inhibitor in June 2020  Tamoxifen counseling: We discussed the risks and benefits of tamoxifen. These include but not limited to insomnia, hot flashes, mood changes, vaginal dryness, and weight gain. Although rare, serious side effects including endometrial cancer, risk of blood clots were also discussed. We strongly believe that the benefits far outweigh the risks. Patient understands these risks and consented to starting treatment. Planned treatment duration is 10 years.  Return to clinic in 3 months  for survivorship care plan visit No orders of the defined types were placed in this encounter.  The patient has a good understanding of the overall plan. she agrees with it. she will call with any problems that may develop before the next visit here.   Harriette Ohara, MD 08/24/18

## 2018-08-24 NOTE — Telephone Encounter (Signed)
Gave patient avs and calendar.   °

## 2018-08-24 NOTE — Assessment & Plan Note (Addendum)
05/13/2018:Right lumpectomy: IDC with DCIS, 1.2 cm, focally involves lateral margin, 0/4 lymph nodes negative, ER 50%, PR 0%, HER-2 negative, Ki-67 40%, T1 CN 0 stage Ia Oncotype DX recurrence score 31: 19% risk of distant recurrence of 9 years with hormone therapy alone Chemo benefit greater than 15% 06/11/2018: Reexcision for lateral margin: DCIS  Recommendation: 1.    Patient refused chemotherapy  2.  Adjuvant radiation therapy completed 08/19/2018 3.  Followed by adjuvant antiestrogen therapy started 08/24/2018 ---------------------------------------------------------------- Current treatment: Adjuvant antiestrogen therapy with tamoxifen 20 mg daily, may switch to aromatase inhibitor in June 2020  Tamoxifen counseling: We discussed the risks and benefits of tamoxifen. These include but not limited to insomnia, hot flashes, mood changes, vaginal dryness, and weight gain. Although rare, serious side effects including endometrial cancer, risk of blood clots were also discussed. We strongly believe that the benefits far outweigh the risks. Patient understands these risks and consented to starting treatment. Planned treatment duration is 10 years.  Return to clinic in 3 months for survivorship care plan visit

## 2018-09-28 ENCOUNTER — Other Ambulatory Visit: Payer: Self-pay | Admitting: *Deleted

## 2018-09-28 MED ORDER — TAMOXIFEN CITRATE 20 MG PO TABS: 20.0000 mg | ORAL_TABLET | Freq: Every day | ORAL | 6 refills | Status: DC

## 2018-12-03 ENCOUNTER — Telehealth: Payer: Self-pay | Admitting: Adult Health

## 2018-12-03 NOTE — Telephone Encounter (Signed)
Called to reschedule per Mendel Ryder

## 2018-12-08 ENCOUNTER — Encounter: Payer: BLUE CROSS/BLUE SHIELD | Admitting: Adult Health

## 2018-12-11 ENCOUNTER — Encounter: Payer: BLUE CROSS/BLUE SHIELD | Admitting: Adult Health

## 2018-12-31 ENCOUNTER — Telehealth: Payer: Self-pay | Admitting: Adult Health

## 2018-12-31 NOTE — Telephone Encounter (Signed)
Called patient regarding upcoming [facility] appointment, left patient a voicemail and e-mail will be sent.

## 2019-01-01 ENCOUNTER — Telehealth: Payer: Self-pay | Admitting: Adult Health

## 2019-01-01 ENCOUNTER — Inpatient Hospital Stay: Payer: BLUE CROSS/BLUE SHIELD | Admitting: Adult Health

## 2019-01-01 NOTE — Telephone Encounter (Signed)
Called patient regarding new appointments in July, patient is aware.

## 2019-02-24 ENCOUNTER — Telehealth: Payer: Self-pay | Admitting: Adult Health

## 2019-02-24 NOTE — Telephone Encounter (Signed)
I talk with patient regarding video visit °

## 2019-03-17 ENCOUNTER — Inpatient Hospital Stay: Payer: BC Managed Care – PPO | Attending: Adult Health | Admitting: Adult Health

## 2019-03-17 DIAGNOSIS — C50411 Malignant neoplasm of upper-outer quadrant of right female breast: Secondary | ICD-10-CM

## 2019-03-17 DIAGNOSIS — Z170 Estrogen receptor positive status [ER+]: Secondary | ICD-10-CM

## 2019-03-17 NOTE — Progress Notes (Signed)
No show

## 2019-03-21 IMAGING — MG MM PLC BREAST LOC DEV 1ST LESION INC*R*
5 series · 5 of 5 positions shown · non-contrast
Comparison: Previous exam(s).

CLINICAL DATA: Recently diagnosed invasive ductal carcinoma in the
9:30 o'clock position of the right breast.

EXAM:
MAMMOGRAPHIC GUIDED RADIOACTIVE SEED LOCALIZATION OF THE RIGHT
BREAST

[R LM (1 of 2)]
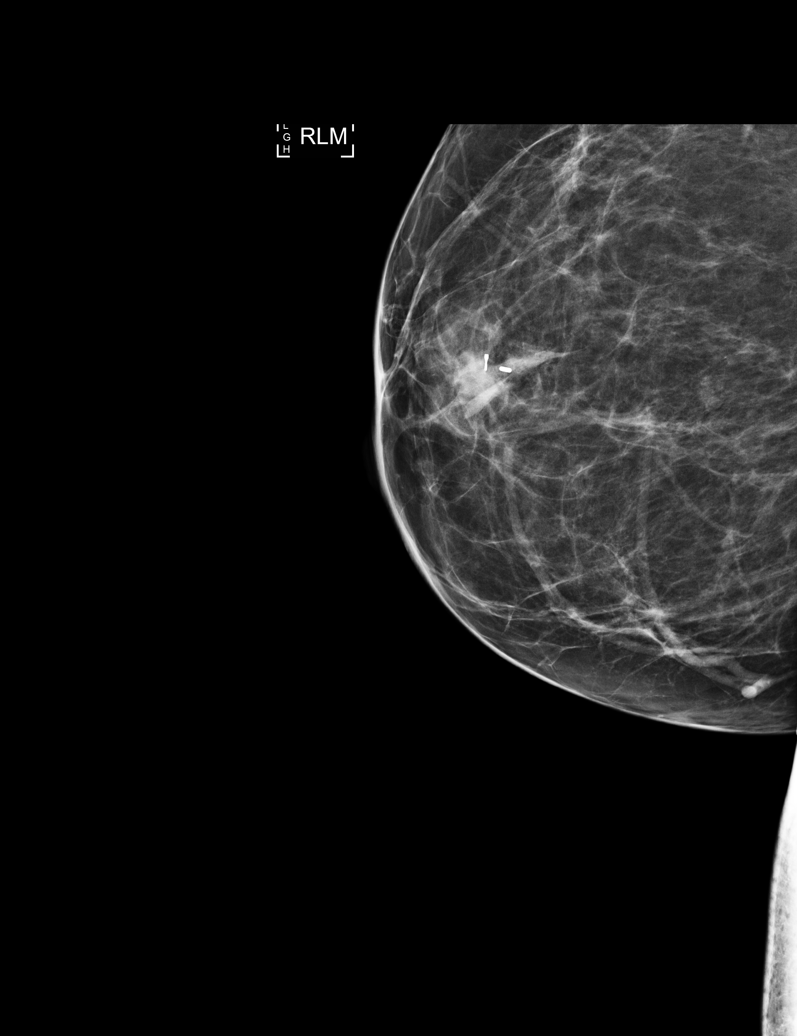

[R CC (1 of 3)]
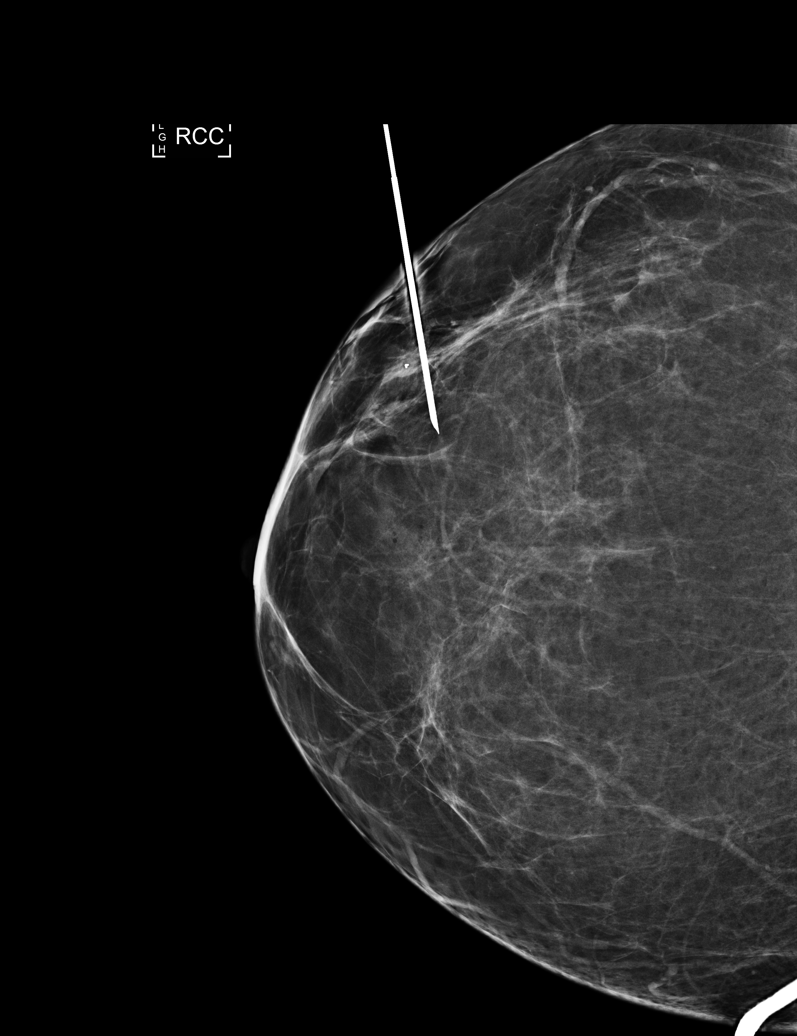

[R CC (2 of 3)]
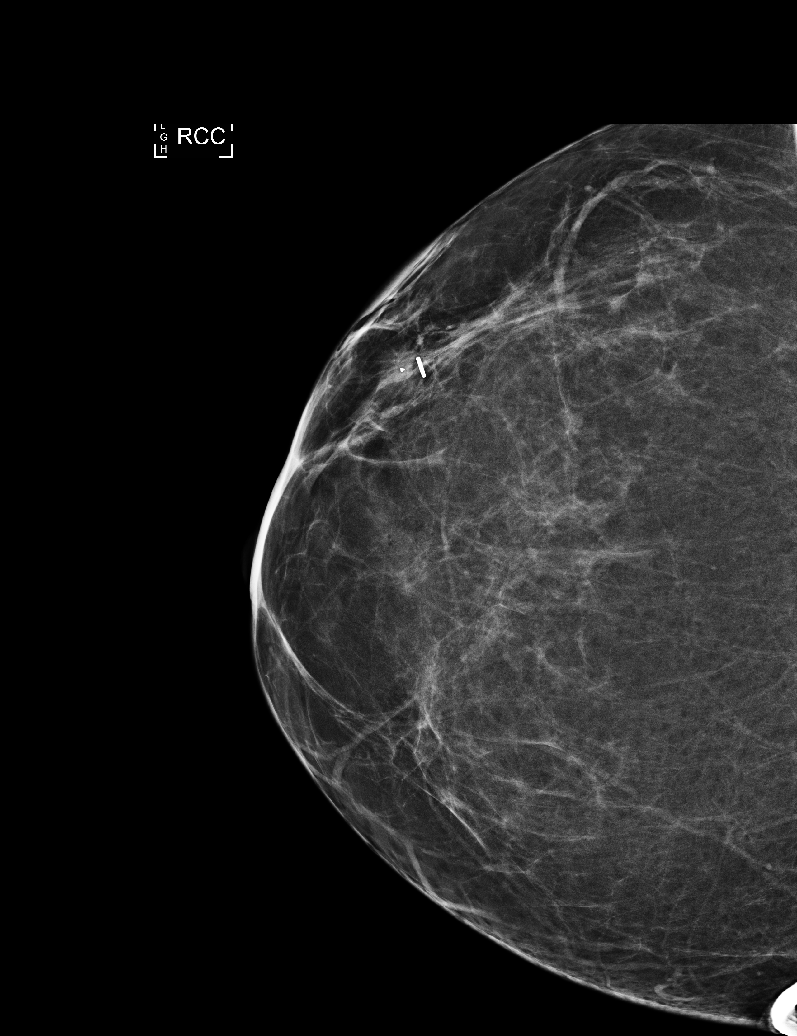

[R LM (2 of 2)]
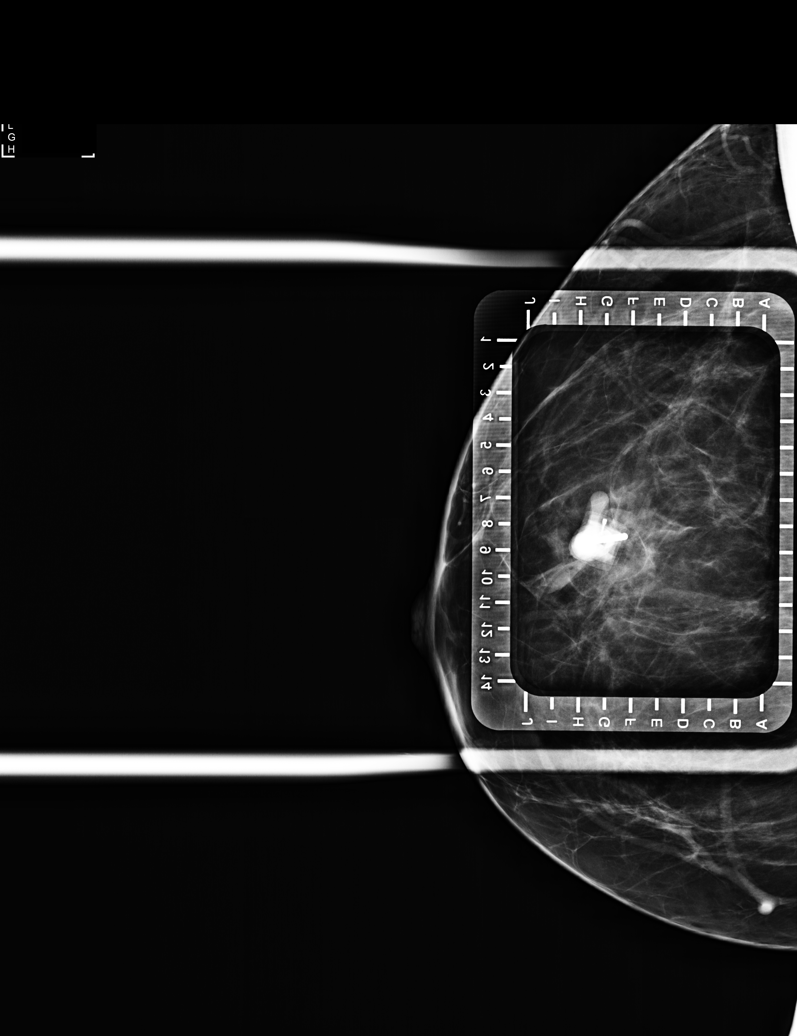

[R CC (3 of 3)]
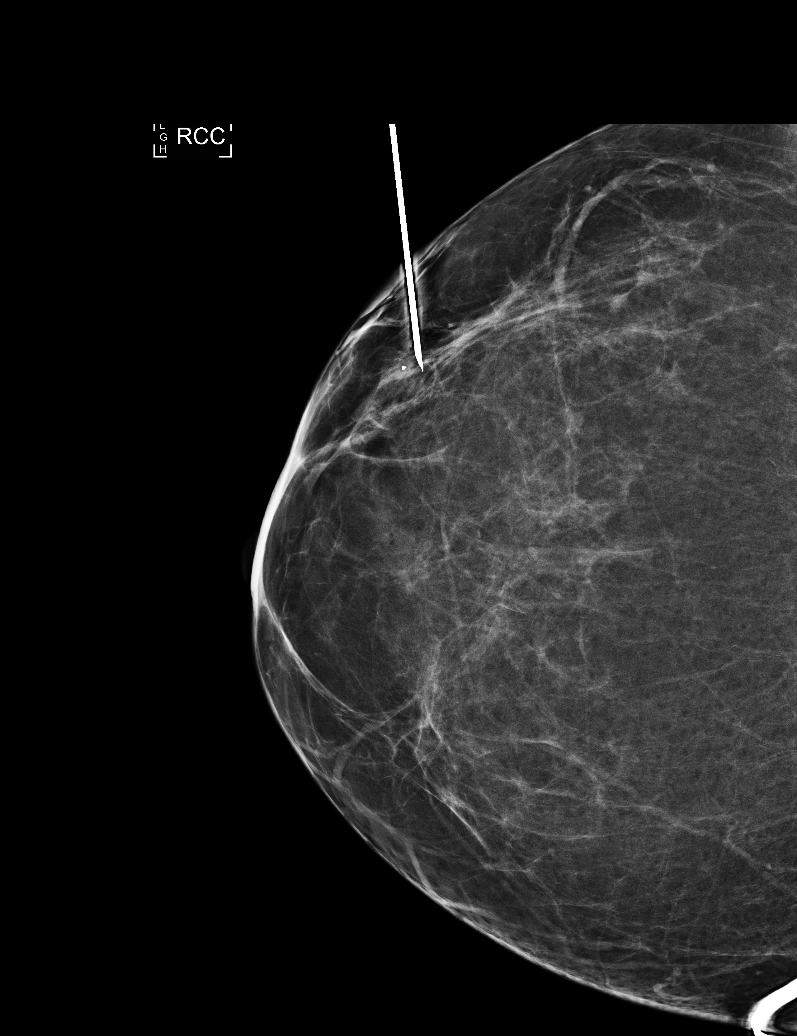

[5 of 5 positions shown; findings below may reference images not displayed]

FINDINGS: Patient presents for radioactive seed localization prior to right
lumpectomy. I met with the patient and we discussed the procedure of
seed localization including benefits and alternatives. We discussed
the high likelihood of a successful procedure. We discussed the
risks of the procedure including infection, bleeding, tissue injury
and further surgery. We discussed the low dose of radioactivity
involved in the procedure. Informed, written consent was given.

The usual time-out protocol was performed immediately prior to the
procedure.

Using mammographic guidance, sterile technique, 1% lidocaine and an
[id number] radioactive seed, the recently biopsied mass and tapered
biopsy marker clip 9:30 o'clock position of the right breast were
localized using a lateral approach. The follow-up mammogram images
confirm the seed in the expected location and were marked for Dr.
Yoga.

Follow-up survey of the patient confirms presence of the radioactive
seed.

Order number of P-YI6 seed:  [id number].

Total activity:  0.252 mCi reference Date: 05/01/2018

The patient tolerated the procedure well and was released from the
[REDACTED]. She was given instructions regarding seed removal.
IMPRESSION: Radioactive seed localization right breast. No apparent
complications.

## 2019-03-21 IMAGING — MG MM CLIP PLACEMENT
2 series · 2 of 2 positions shown · non-contrast
Comparison: Previous examinations in [HOSPITAL], [facility].

CLINICAL DATA: Recently diagnosed invasive ductal carcinoma in the
9:30 o'clock position of the right breast, biopsied in [HOSPITAL],
[facility]. No post clip mammogram images were obtained. Pre
radioactive seed localization examination.

EXAM:
DIAGNOSTIC RIGHT MAMMOGRAM POST ULTRASOUND BIOPSY

[R ML]
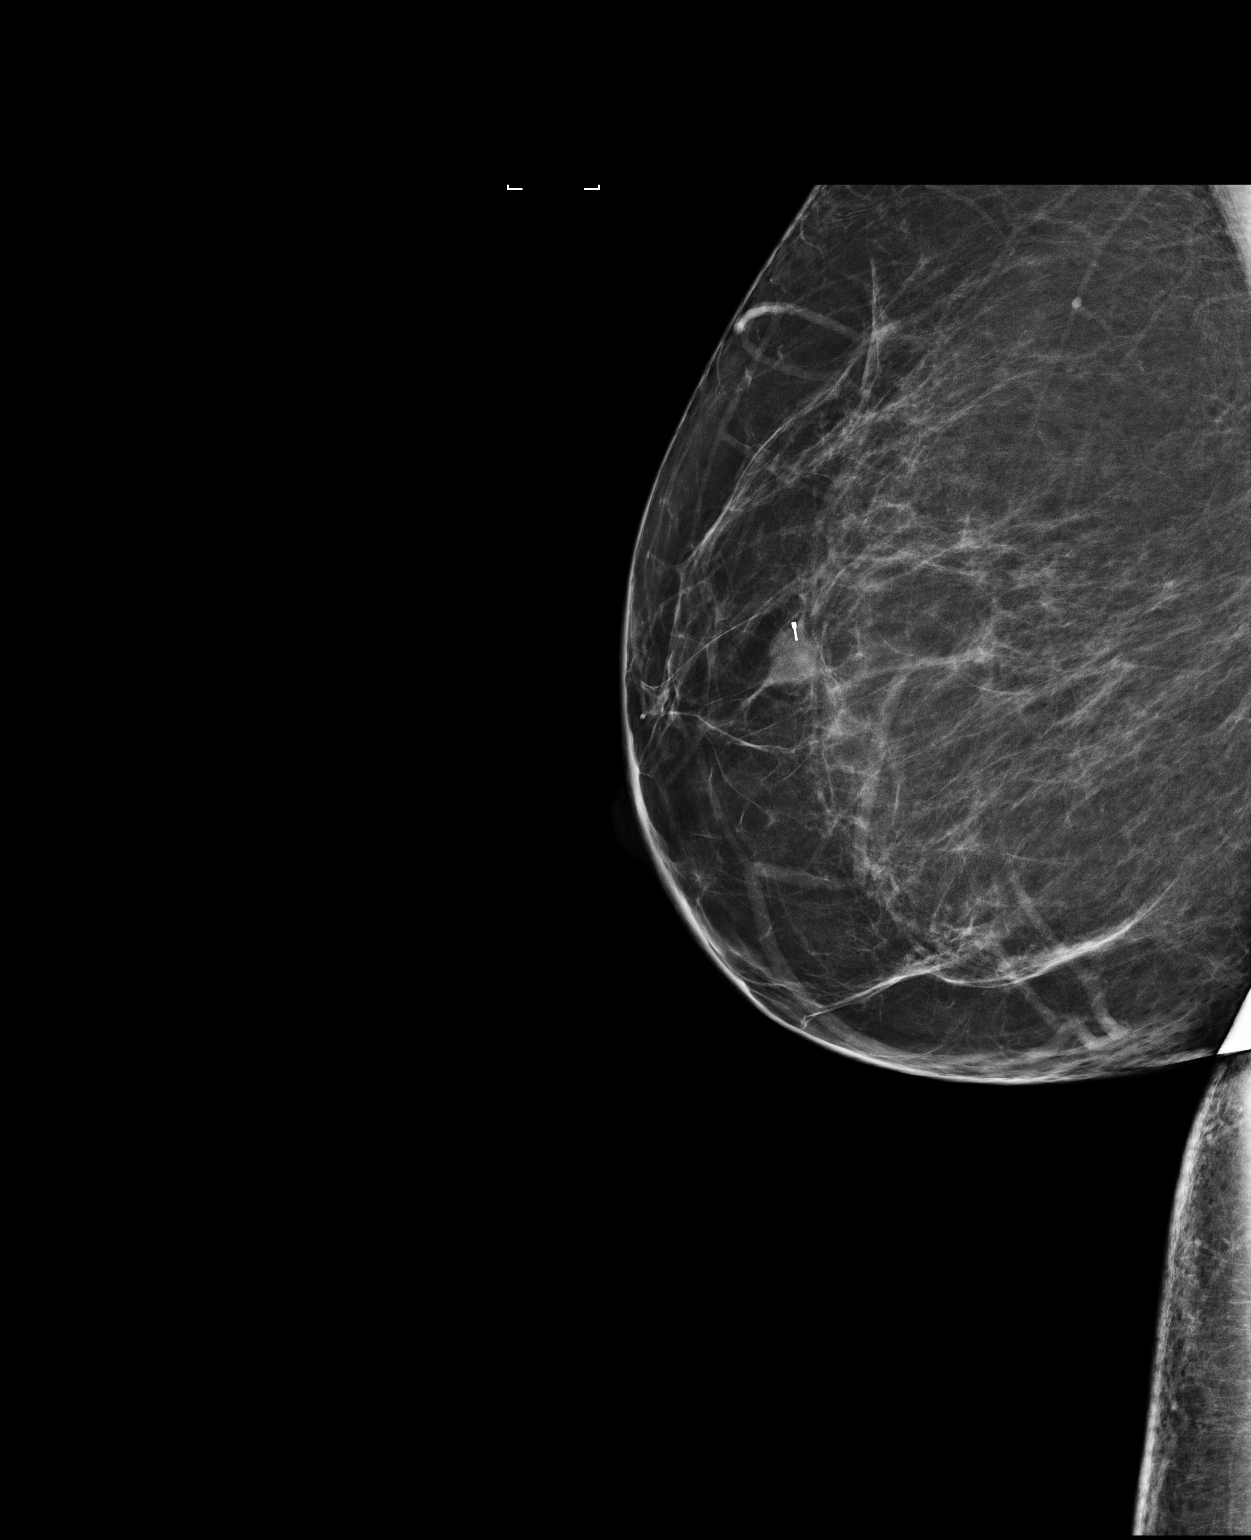

[R CC]
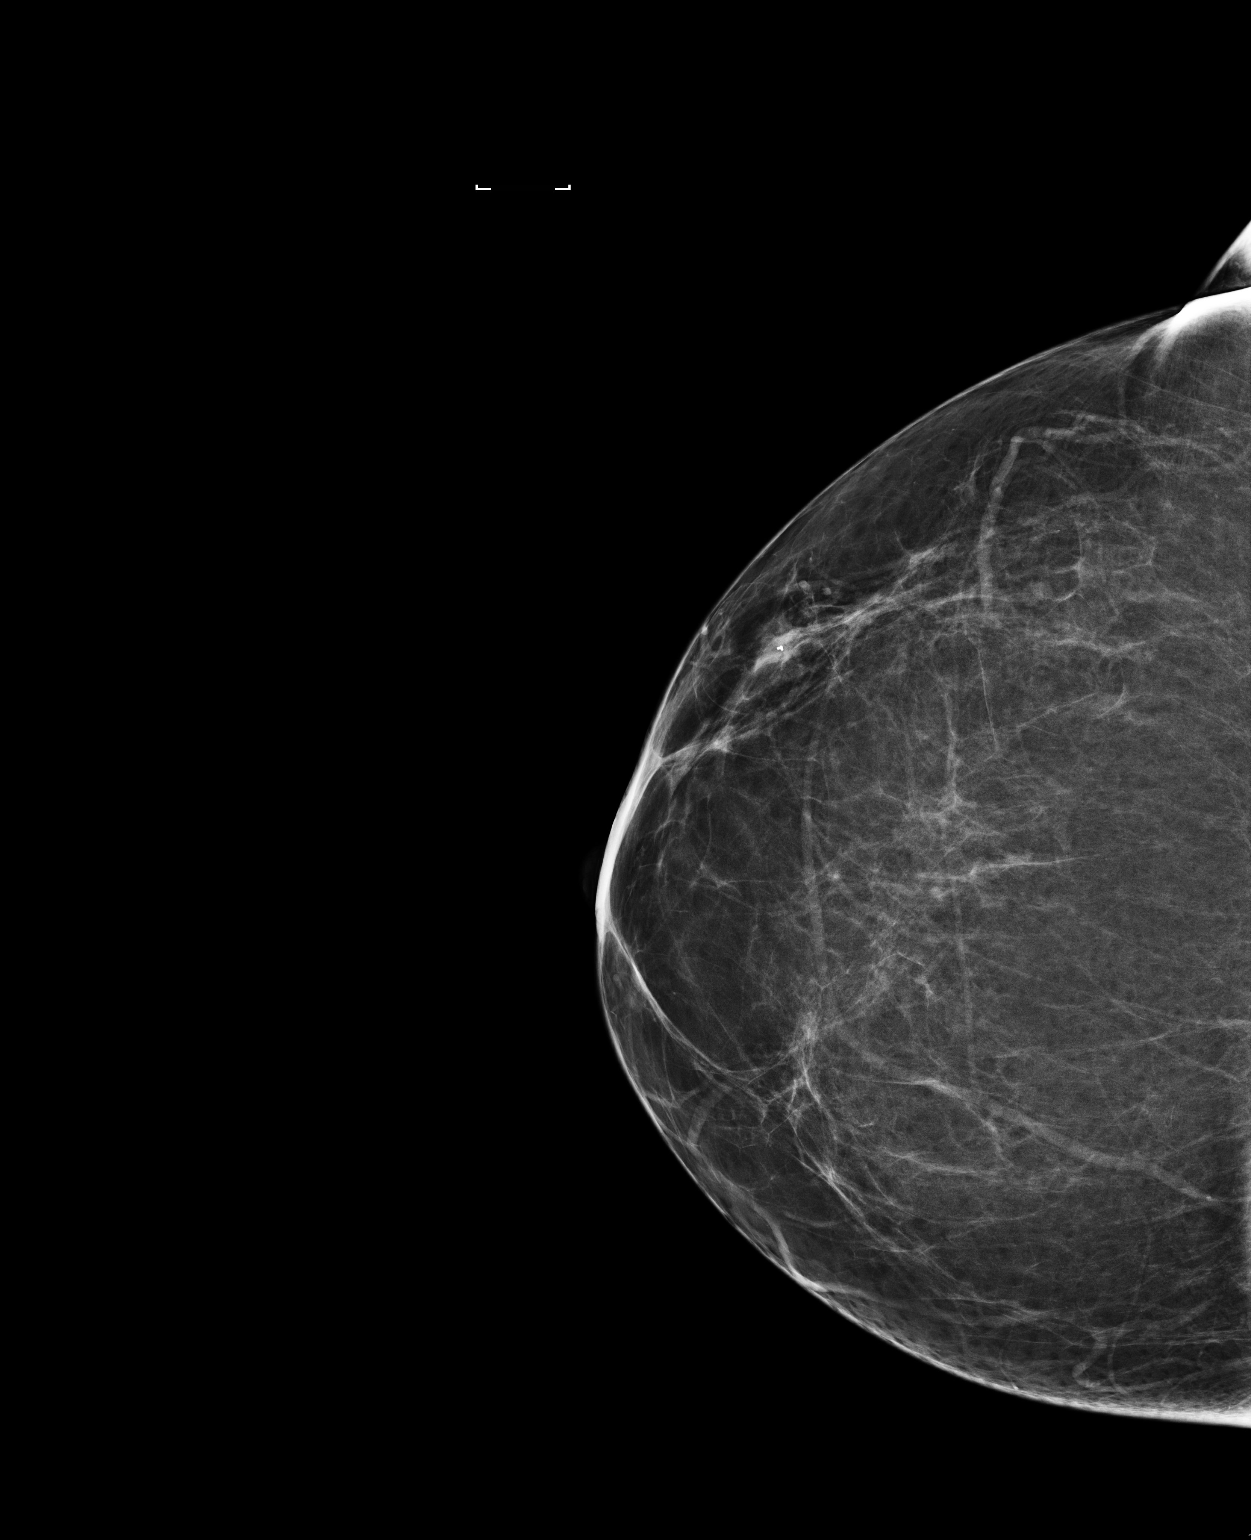

[2 of 2 positions shown; findings below may reference images not displayed]

FINDINGS: Mammographic images were obtained following ultrasound guided biopsy
of the previously demonstrated mass in the 9:30 o'clock position of
the right breast. These demonstrate a tapered biopsy marker clip
within the biopsied mass.
IMPRESSION: Appropriate clip deployment following right breast ultrasound-guided
core needle biopsy.

Final Assessment: Post Procedure Mammograms for Marker Placement

## 2019-04-19 ENCOUNTER — Telehealth: Payer: Self-pay | Admitting: *Deleted

## 2019-04-19 NOTE — Telephone Encounter (Signed)
Pt called to r/s SCP video visit. Pt informed her phone was not working. Scheduling msg sent.

## 2019-04-20 ENCOUNTER — Telehealth: Payer: Self-pay

## 2019-04-20 NOTE — Telephone Encounter (Signed)
RN returned call.  Voicemail left for patient to call back.

## 2019-04-21 ENCOUNTER — Telehealth: Payer: Self-pay | Admitting: Adult Health

## 2019-04-21 NOTE — Telephone Encounter (Signed)
I talk with patient regarding video visit °

## 2019-06-08 ENCOUNTER — Encounter: Payer: Self-pay | Admitting: Adult Health

## 2019-06-08 ENCOUNTER — Other Ambulatory Visit: Payer: Self-pay

## 2019-06-08 ENCOUNTER — Inpatient Hospital Stay: Payer: BC Managed Care – PPO | Attending: Adult Health | Admitting: Adult Health

## 2019-06-08 ENCOUNTER — Telehealth: Payer: Self-pay | Admitting: Hematology and Oncology

## 2019-06-08 DIAGNOSIS — Z170 Estrogen receptor positive status [ER+]: Secondary | ICD-10-CM | POA: Diagnosis not present

## 2019-06-08 DIAGNOSIS — Z79810 Long term (current) use of selective estrogen receptor modulators (SERMs): Secondary | ICD-10-CM

## 2019-06-08 DIAGNOSIS — Z923 Personal history of irradiation: Secondary | ICD-10-CM

## 2019-06-08 DIAGNOSIS — C50411 Malignant neoplasm of upper-outer quadrant of right female breast: Secondary | ICD-10-CM | POA: Diagnosis not present

## 2019-06-08 NOTE — Telephone Encounter (Signed)
I talk with patient regarding schedule  

## 2019-06-08 NOTE — Progress Notes (Signed)
SURVIVORSHIP VIRTUAL VISIT:  I connected with Donna Gould on 06/08/19 at  2:00 PM EDT by telephone and verified that I am speaking with the correct person using two identifiers.  I discussed the limitations, risks, security and privacy concerns of performing an evaluation and management service by telephone and the availability of in person appointments. I also discussed with the patient that there may be a patient responsible charge related to this service. The patient expressed understanding and agreed to proceed.   BRIEF ONCOLOGIC HISTORY:  Oncology History  Malignant neoplasm of upper-outer quadrant of right breast in female, estrogen receptor positive ([place])  04/08/2018 Initial Diagnosis   Screening detected right breast mass at 9:30 position measuring 9 mm, biopsy revealed IDC intermediate grade, ER 50%, PR negative HER-2 negative, Ki-67 40%, T1b N0 stage Ia   05/06/2018 Genetic Testing   Negative genetic testing on the [facility] Breast Cancer STAT + Common Hereditary Cancers Panel. The STAT Breast cancer panel offered by [facility] includes sequencing and rearrangement analysis for the following 9 genes:  ATM, BRCA1, BRCA2, CDH1, CHEK2, PALB2, PTEN, STK11 and TP53.  The Common Hereditary Cancers Panel offered by [facility] includes sequencing and/or deletion duplication testing of the following 47 genes: APC, ATM, AXIN2, BARD1, BMPR1A, BRCA1, BRCA2, BRIP1, CDH1, CDKN2A (p14ARF), CDKN2A (p16INK4a), CKD4, CHEK2, CTNNA1, DICER1, EPCAM (Deletion/duplication testing only), GREM1 (promoter region deletion/duplication testing only), KIT, MEN1, MLH1, MSH2, MSH3, MSH6, MUTYH, NBN, NF1, NHTL1, PALB2, PDGFRA, PMS2, POLD1, POLE, PTEN, RAD50, RAD51C, RAD51D, SDHB, SDHC, SDHD, SMAD4, SMARCA4. STK11, TP53, TSC1, TSC2, and VHL.  The following genes were evaluated for sequence changes only: SDHA and HOXB13 c.251G>A variant only.  The report date is 05/06/18   05/13/2018 Surgery   Right lumpectomy: IDC with DCIS, 1.2  cm, focally involves lateral margin, 0/4 lymph nodes negative, ER 50%, PR 0%, HER-2 negative, Ki-67 40%, T1 CN 0 stage Ia   05/27/2018 Cancer Staging   Staging form: Breast, AJCC 8th Edition - Pathologic: Stage IA (pT1c, pN0, cM0, G2, ER+, PR-, HER2-) - Signed by Gardenia Phlegm, NP on 05/27/2018   06/11/2018 Surgery   Right breast reexcision of lateral margin: DCIS no invasive cancer, 0.1 cm from final lateral margin.   06/11/2018 Oncotype testing   Oncotype DX 31: 19% risk of recurrence, patient refused chemotherapy   07/13/2018 - 08/20/2018 Radiation Therapy   Adjuvant radiation therapy at [facility]   08/24/2018 -  Anti-estrogen oral therapy   Tamoxifen 20 mg daily (patient's last menstrual cycle July 2019)     INTERVAL HISTORY:  Donna Gould to review her survivorship care plan detailing her treatment course for breast cancer, as well as monitoring long-term side effects of that treatment, education regarding health maintenance, screening, and overall wellness and health promotion.     Overall, Donna Gould reports feeling quite well.  She is taking Tamoxifen daily.  She has increased hot flashes.  She notes they are worse in the morning.  She denies arthralgias, and is working on her diet and exercising regularly.  She denies any vaginal discharge.  Underneath where she had her lumpectomy.  She notes a mass of tissue, she says it feels funny and thick.    REVIEW OF SYSTEMS:  Review of Systems  Constitutional: Negative for appetite change, chills, diaphoresis, fatigue, fever and unexpected weight change.  HENT:   Negative for hearing loss, lump/mass, mouth sores, sore throat and trouble swallowing.   Eyes: Negative for eye problems and icterus.  Respiratory: Negative for chest tightness,  cough and shortness of breath.   Cardiovascular: Negative for chest pain, leg swelling and palpitations.  Gastrointestinal: Negative for abdominal distention, abdominal pain, constipation, diarrhea,  nausea and vomiting.  Endocrine: Negative for hot flashes.  Genitourinary: Negative for difficulty urinating.   Musculoskeletal: Negative for arthralgias.  Skin: Negative for itching and rash.  Neurological: Negative for dizziness, extremity weakness, headaches and numbness.  Hematological: Negative for adenopathy. Does not bruise/bleed easily.  Psychiatric/Behavioral: Negative for depression. The patient is not nervous/anxious.   Breast: Denies any new nodularity, masses, tenderness, nipple changes, or nipple discharge.      ONCOLOGY TREATMENT TEAM:  1. Surgeon:  Dr. Barry Dienes at [facility] 2. Medical Oncologist: Dr. Lindi Adie  3. Radiation Oncologist: Dr. Francesca Jewett    PAST MEDICAL/SURGICAL HISTORY:  Past Medical History:  Diagnosis Date  . Anemia    history of anemia, diet controlled  . Arthritis    knee  . Breast cancer ([place])    Right  . Family history of bone cancer   . Family history of breast cancer   . Family history of leukemia   . Hypertension   . Pre-diabetes   . Sickle cell trait [facility])    Past Surgical History:  Procedure Laterality Date  . BREAST LUMPECTOMY WITH RADIOACTIVE SEED AND SENTINEL LYMPH NODE BIOPSY Right 05/13/2018   Procedure: RIGHT BREAST LUMPECTOMY WITH RADIOACTIVE SEED AND RIGHT SENTINEL LYMPH NODE BIOPSY;  Surgeon: [name], MD;  Location: [facility];  Service: General;  Laterality: Right;  . COLONOSCOPY    . DILATION AND CURETTAGE, DIAGNOSTIC / THERAPEUTIC    . HERNIA REPAIR     Umbilical age 40  . RE-EXCISION OF BREAST LUMPECTOMY Right 06/11/2018   Procedure: RE-EXCISION OF BREAST LUMPECTOMY;  Surgeon: [name], MD;  Location: [facility];  Service: General;  Laterality: Right;  . WISDOM TOOTH EXTRACTION       ALLERGIES:  No Known Allergies   CURRENT MEDICATIONS:  Outpatient Encounter Medications as of 06/08/2019  Medication Sig  . lisinopril-hydrochlorothiazide (PRINZIDE,ZESTORETIC) 20-25 MG tablet Take 1  tablet by mouth every morning.  . tamoxifen (NOLVADEX) 20 MG tablet Take 1 tablet (20 mg total) by mouth daily.   No facility-administered encounter medications on file as of 06/08/2019.      ONCOLOGIC FAMILY HISTORY:  Family History  Problem Relation Age of Onset  . Breast cancer Mother        dx 22s  . Bone cancer Father        d. 13  . Breast cancer Maternal Aunt   . Breast cancer Maternal Aunt   . Breast cancer Cousin 2       maternal cousin  . Leukemia Cousin        d. 40s     GENETIC COUNSELING/TESTING: See above  SOCIAL HISTORY:  Social History   Socioeconomic History  . Marital status: Married    Spouse name: Not on file  . Number of children: Not on file  . Years of education: Not on file  . Highest education level: Not on file  Occupational History  . Not on file  Social Needs  . Financial resource strain: Not on file  . Food insecurity    Worry: Not on file    Inability: Not on file  . Transportation needs    Medical: Not on file    Non-medical: Not on file  Tobacco Use  . Smoking status: Never Smoker  . Smokeless tobacco: Never Used  Substance and Sexual Activity  . Alcohol use: Not Currently    Frequency: Never    Comment: wine or wine cooler socially  . Drug use: Never  . Sexual activity: Yes    Birth control/protection: None  Lifestyle  . Physical activity    Days per week: Not on file    Minutes per session: Not on file  . Stress: Not on file  Relationships  . Social Herbalist on phone: Not on file    Gets together: Not on file    Attends religious service: Not on file    Active member of club or organization: Not on file    Attends meetings of clubs or organizations: Not on file    Relationship status: Not on file  . Intimate partner violence    Fear of current or ex partner: Not on file    Emotionally abused: Not on file    Physically abused: Not on file    Forced sexual activity: Not on file  Other Topics Concern   . Not on file  Social History Narrative   Husband lives with pt\     OBSERVATIONS/OBJECTIVE:  Patient sounds well and is in no apparent distress.  Breathing is non labored and mood and behavior are normal.    LABORATORY DATA:  None for this visit.  DIAGNOSTIC IMAGING:  None for this visit.      ASSESSMENT AND PLAN:  Ms.. Cdebaca is a pleasant 59 y.o. female with Stage IA right breast invasive ductal carcinoma, ER+/PR-/HER2-, diagnosed in 04/2018, treated with lumpectomy, adjuvant radiation therapy, and anti-estrogen therapy with Tamoxifen beginning in 08/2018.  She presents to the Survivorship Clinic for our initial meeting and routine follow-up post-completion of treatment for breast cancer.    1. Stage IA right breast cancer:  Ms. Starace is continuing to recover from definitive treatment for breast cancer. She will follow-up with her medical oncologist, Dr. Lindi Adie in 07/2018 with history and physical exam per surveillance protocol.  She will continue her anti-estrogen therapy with Tamoxifen. Thus far, she is tolerating the Tamoxifen well, with minimal side effects (other than #2).  Her mammogram is due 03/2020, I requested my nurse get a copy of her most recent mammogram that was done two months ago at [facility].   Today, a comprehensive survivorship care plan and treatment summary was reviewed with the patient today detailing her breast cancer diagnosis, treatment course, potential late/long-term effects of treatment, appropriate follow-up care with recommendations for the future, and patient education resources.  A copy of this summary, along with a letter will be sent to the patient's primary care provider via mail/fax/In Basket message after today's visit.    2. Hot flashes: We reviewed common triggers of hot flashes and briefly discussed other measures to alleviate hot flashes, such as acupuncture or medications.  She isn't at the point of needing these medications, however she  knows to reach out if her hot flashes get to that point.    3. Bone health:  Given Ms. Elahi's history of breast cancer, she is at slight risk for bone demineralization.  I counseled her that tamoxifen has a protective effect on the bones.  She was given education on specific activities to promote bone health.  4. Cancer screening:  Due to Ms. Rabalais's history and her age, she should receive screening for skin cancers, colon cancer, and gynecologic cancers.  The information and recommendations are listed on the patient's comprehensive care plan/treatment summary  and were reviewed in detail with the patient.    5. Health maintenance and wellness promotion: Ms. Ketchem was encouraged to consume 5-7 servings of fruits and vegetables per day. We reviewed the "Nutrition Rainbow" handout, as well as the handout "Take Control of Your Health and Reduce Your Cancer Risk" from the [employer].  She was also encouraged to engage in moderate to vigorous exercise for 30 minutes per day most days of the week. We discussed the [employer] fitness program, which is designed for cancer survivors to help them become more physically fit after cancer treatments.  She was instructed to limit her alcohol consumption and continue to abstain from tobacco use.     6. Support services/counseling: It is not uncommon for this period of the patient's cancer care trajectory to be one of many emotions and stressors.  We discussed how this can be increasingly difficult during the times of quarantine and social distancing due to the COVID-19 pandemic.   She was given information regarding our available services and encouraged to contact me with any questions or for help enrolling in any of our support group/programs.    Follow up instructions:    -Return to cancer center in 07/2019 for f/u with Dr. Lindi Adie  -Mammogram due in 03/2020 -Follow up with surgery 01/2020 -She is welcome to return back to the Survivorship Clinic  at any time; no additional follow-up needed at this time.  -Consider referral back to survivorship as a long-term survivor for continued surveillance  The patient was provided an opportunity to ask questions and all were answered. The patient agreed with the plan and demonstrated an understanding of the instructions.   The patient was advised to call back or seek an in-person evaluation if the symptoms worsen or if the condition fails to improve as anticipated.   I provided 23 minutes of non face-to-face telephone visit time during this encounter, and > 50% was spent counseling as documented under my assessment & plan.  Scot Dock, NP

## 2019-07-19 NOTE — Progress Notes (Signed)
Patient Care Team: Andres Shad, MD as PCP - General (Family Medicine) Nicholas Lose, MD as Consulting Physician (Hematology and Oncology) Meda Klinefelter, MD as Referring Physician (Radiation Oncology) [name], MD as Consulting Physician (General Surgery)  DIAGNOSIS:    ICD-10-CM   1. Malignant neoplasm of upper-outer quadrant of right breast in female, estrogen receptor positive ([place])  C50.411    Z17.0     SUMMARY OF ONCOLOGIC HISTORY: Oncology History  Malignant neoplasm of upper-outer quadrant of right breast in female, estrogen receptor positive ([place])  04/08/2018 Initial Diagnosis   Screening detected right breast mass at 9:30 position measuring 9 mm, biopsy revealed IDC intermediate grade, ER 50%, PR negative HER-2 negative, Ki-67 40%, T1b N0 stage Ia   05/06/2018 Genetic Testing   Negative genetic testing on the [facility] Breast Cancer STAT + Common Hereditary Cancers Panel. The STAT Breast cancer panel offered by [facility] includes sequencing and rearrangement analysis for the following 9 genes:  ATM, BRCA1, BRCA2, CDH1, CHEK2, PALB2, PTEN, STK11 and TP53.  The Common Hereditary Cancers Panel offered by [facility] includes sequencing and/or deletion duplication testing of the following 47 genes: APC, ATM, AXIN2, BARD1, BMPR1A, BRCA1, BRCA2, BRIP1, CDH1, CDKN2A (p14ARF), CDKN2A (p16INK4a), CKD4, CHEK2, CTNNA1, DICER1, EPCAM (Deletion/duplication testing only), GREM1 (promoter region deletion/duplication testing only), KIT, MEN1, MLH1, MSH2, MSH3, MSH6, MUTYH, NBN, NF1, NHTL1, PALB2, PDGFRA, PMS2, POLD1, POLE, PTEN, RAD50, RAD51C, RAD51D, SDHB, SDHC, SDHD, SMAD4, SMARCA4. STK11, TP53, TSC1, TSC2, and VHL.  The following genes were evaluated for sequence changes only: SDHA and HOXB13 c.251G>A variant only.  The report date is 05/06/18   05/13/2018 Surgery   Right lumpectomy: IDC with DCIS, 1.2 cm, focally involves lateral margin, 0/4 lymph nodes negative, ER 50%, PR 0%,  HER-2 negative, Ki-67 40%, T1 CN 0 stage Ia   05/27/2018 Cancer Staging   Staging form: Breast, AJCC 8th Edition - Pathologic: Stage IA (pT1c, pN0, cM0, G2, ER+, PR-, HER2-) - Signed by Gardenia Phlegm, NP on 05/27/2018   06/11/2018 Surgery   Right breast reexcision of lateral margin: DCIS no invasive cancer, 0.1 cm from final lateral margin.   06/11/2018 Oncotype testing   Oncotype DX 31: 19% risk of recurrence, patient refused chemotherapy   07/13/2018 - 08/20/2018 Radiation Therapy   Adjuvant radiation [facility] at [facility]   08/24/2018 -  Anti-estrogen oral therapy   Tamoxifen 20 mg daily (patient's last menstrual cycle July 2019)     CHIEF COMPLIANT: Follow-up of right breast cancer on tamoxifen  INTERVAL HISTORY: Donna Gould is a 58 y.o. with above-mentioned history of right breast cancer treated with lumpectomy, she refused chemotherapy, underwent radiation, and is currently on tamoxifen. I last saw her a year ago. Mammogram on 04/14/19 showed no evidence of malignancy bilaterally. She presents to the clinic today for follow-up.  She has profound hot flashes which are interrupting her life.  REVIEW OF SYSTEMS:   Constitutional: Denies fevers, chills or abnormal weight loss Eyes: Denies blurriness of vision Ears, nose, mouth, throat, and face: Denies mucositis or sore throat Respiratory: Denies cough, dyspnea or wheezes Cardiovascular: Denies palpitation, chest discomfort Gastrointestinal: Denies nausea, heartburn or change in bowel habits Skin: Denies abnormal skin rashes Lymphatics: Denies new lymphadenopathy or easy bruising Neurological: Denies numbness, tingling or new weaknesses Behavioral/Psych: Mood is stable, no new changes  Extremities: No lower extremity edema Breast: Mild tenderness in the surgical scars s All other systems were reviewed with the patient and are negative.  I have reviewed  the past medical history, past surgical history, social history and  family history with the patient and they are unchanged from previous note.  ALLERGIES:  has No Known Allergies.  MEDICATIONS:  Current Outpatient Medications  Medication Sig Dispense Refill   lisinopril-hydrochlorothiazide (PRINZIDE,ZESTORETIC) 20-25 MG tablet Take 1 tablet by mouth every morning.  3   tamoxifen (NOLVADEX) 20 MG tablet Take 1 tablet (20 mg total) by mouth daily. 30 tablet 6   No current facility-administered medications for this visit.     PHYSICAL EXAMINATION: ECOG PERFORMANCE STATUS: 1 - Symptomatic but completely ambulatory  Vitals:   07/20/19 0903  BP: 135/78  Pulse: (!) 55  Resp: 18  Temp: 98.2 F (36.8 C)  SpO2: 100%   Filed Weights   07/20/19 0903  Weight: 239 lb 8 oz (108.6 kg)    GENERAL: alert, no distress and comfortable SKIN: skin color, texture, turgor are normal, no rashes or significant lesions EYES: normal, Conjunctiva are pink and non-injected, sclera clear OROPHARYNX: no exudate, no erythema and lips, buccal mucosa, and tongue normal  NECK: supple, thyroid normal size, non-tender, without nodularity LYMPH: no palpable lymphadenopathy in the cervical, axillary or inguinal LUNGS: clear to auscultation and percussion with normal breathing effort HEART: regular rate & rhythm and no murmurs and no lower extremity edema ABDOMEN: abdomen soft, non-tender and normal bowel sounds MUSCULOSKELETAL: no cyanosis of digits and no clubbing  NEURO: alert & oriented x 3 with fluent speech, no focal motor/sensory deficits EXTREMITIES: No lower extremity edema    LABORATORY DATA:  I have reviewed the data as listed CMP Latest Ref Rng & Units 06/09/2018 05/12/2018  Glucose 70 - 99 mg/dL 103(H) 122(H)  BUN 6 - 20 mg/dL 16 10  Creatinine 0.44 - 1.00 mg/dL 1.08(H) 1.17(H)  Sodium 135 - 145 mmol/L 144 139  Potassium 3.5 - 5.1 mmol/L 3.4(L) 3.7  Chloride 98 - 111 mmol/L 102 102  CO2 22 - 32 mmol/L 33(H) 27  Calcium 8.9 - 10.3 mg/dL 9.2 8.8(L)  Total  Protein 6.5 - 8.1 g/dL - 7.8  Total Bilirubin 0.3 - 1.2 mg/dL - 1.0  Alkaline Phos 38 - 126 U/L - 45  AST 15 - 41 U/L - 20  ALT 0 - 44 U/L - 16    Lab Results  Component Value Date   WBC 4.8 06/09/2018   HGB 12.0 06/09/2018   HCT 36.2 06/09/2018   MCV 79.6 06/09/2018   PLT 313 06/09/2018   NEUTROABS 2.1 05/12/2018    ASSESSMENT & PLAN:  Malignant neoplasm of upper-outer quadrant of right breast in female, estrogen receptor positive ([place]) 05/13/2018:Right lumpectomy: IDC with DCIS, 1.2 cm, focally involves lateral margin, 0/4 lymph nodes negative, ER 50%, PR 0%, HER-2 negative, Ki-67 40%, T1 CN 0 stage Ia Oncotype DX recurrence score 31: 19% risk of distant recurrence of 9 years with hormone therapy alone Chemo benefit greater than 15% 06/11/2018: Reexcision for lateral margin: DCIS  Recommendation: 1.  Patient refused chemotherapy  2. Adjuvant radiation therapy completed 08/19/2018 3. Followed by adjuvant antiestrogen therapy started 08/24/2018 ---------------------------------------------------------------- Current treatment: Adjuvant antiestrogen therapy with tamoxifen 20 mg daily, switched to anastrozole 07/20/2019  Tamoxifen toxicities: Severe hot flashes I discussed with her that it is best that we switch her to anastrozole therapy. Anastrozole counseling:We discussed the risks and benefits of anti-estrogen therapy with aromatase inhibitors. These include but not limited to insomnia, hot flashes, mood changes, vaginal dryness, bone density loss, and weight gain. We strongly believe that the  benefits far outweigh the risks. Patient understands these risks and consented to starting treatment. Planned treatment duration is 5 years.   Breast cancer surveillance: 1.  Breast exam annually 2.  Mammogram 03/25/2019:[facility] [place]: Benign  Return to clinic in 1 year for follow-up    No orders of the defined types were placed in this encounter.  The patient has a good  understanding of the overall plan. she agrees with it. she will call with any problems that may develop before the next visit here.  Nicholas Lose, MD 07/20/2019  Julious Oka Dorshimer am acting as scribe for Dr. Nicholas Lose.  I have reviewed the above documentation for accuracy and completeness, and I agree with the above.

## 2019-07-20 ENCOUNTER — Ambulatory Visit: Payer: BC Managed Care – PPO | Admitting: Hematology and Oncology

## 2019-07-20 ENCOUNTER — Inpatient Hospital Stay: Payer: BC Managed Care – PPO | Attending: Adult Health | Admitting: Hematology and Oncology

## 2019-07-20 ENCOUNTER — Other Ambulatory Visit: Payer: Self-pay

## 2019-07-20 ENCOUNTER — Telehealth: Payer: Self-pay | Admitting: Hematology and Oncology

## 2019-07-20 DIAGNOSIS — C50411 Malignant neoplasm of upper-outer quadrant of right female breast: Secondary | ICD-10-CM | POA: Diagnosis present

## 2019-07-20 DIAGNOSIS — Z79899 Other long term (current) drug therapy: Secondary | ICD-10-CM | POA: Diagnosis not present

## 2019-07-20 DIAGNOSIS — Z79810 Long term (current) use of selective estrogen receptor modulators (SERMs): Secondary | ICD-10-CM | POA: Diagnosis not present

## 2019-07-20 DIAGNOSIS — Z923 Personal history of irradiation: Secondary | ICD-10-CM | POA: Diagnosis not present

## 2019-07-20 DIAGNOSIS — Z170 Estrogen receptor positive status [ER+]: Secondary | ICD-10-CM

## 2019-07-20 MED ORDER — ANASTROZOLE 1 MG PO TABS: 1.0000 mg | ORAL_TABLET | Freq: Every day | ORAL | 3 refills | Status: AC

## 2019-07-20 NOTE — Telephone Encounter (Signed)
I talk with patient regarding schedule  

## 2019-07-20 NOTE — Assessment & Plan Note (Signed)
05/13/2018:Right lumpectomy: IDC with DCIS, 1.2 cm, focally involves lateral margin, 0/4 lymph nodes negative, ER 50%, PR 0%, HER-2 negative, Ki-67 40%, T1 CN 0 stage Ia Oncotype DX recurrence score 31: 19% risk of distant recurrence of 9 years with hormone therapy alone Chemo benefit greater than 15% 06/11/2018: Reexcision for lateral margin: DCIS  Recommendation: 1.  Patient refused chemotherapy  2. Adjuvant radiation therapy completed 08/19/2018 3. Followed by adjuvant antiestrogen therapy started 08/24/2018 ---------------------------------------------------------------- Current treatment: Adjuvant antiestrogen therapy with tamoxifen 20 mg daily, may switch to aromatase inhibitor in June 2020  Tamoxifen toxicities:  Breast cancer surveillance: 1.  Breast exam 07/20/2019: Benign 2.  Mammogram 03/25/2019:[facility]: Benign  Return to clinic in 1 year for follow-up

## 2019-10-19 ENCOUNTER — Telehealth: Payer: Self-pay | Admitting: Hematology and Oncology

## 2019-10-19 NOTE — Progress Notes (Signed)
HEMATOLOGY-ONCOLOGY TELEPHONE VISIT PROGRESS NOTE  I connected with [name] on 10/20/2019 at  8:30 AM EST by telephone and verified that I am speaking with the correct person using two identifiers.  I discussed the limitations, risks, security and privacy concerns of performing an evaluation and management service by telephone and the availability of in person appointments.  I also discussed with the patient that there may be a patient responsible charge related to this service. The patient expressed understanding and agreed to proceed.   History of Present Illness: Donna Gould is a 59 y.o. female with above-mentioned history of right breast cancer treated with lumpectomy, she refused chemotherapy, underwent radiation, and is currently on anastrozole, after she had significant hot flashes on tamoxifen. She presents over the phone today for follow-up.   Oncology History  Malignant neoplasm of upper-outer quadrant of right breast in female, estrogen receptor positive ([place])  04/08/2018 Initial Diagnosis   Screening detected right breast mass at 9:30 position measuring 9 mm, biopsy revealed IDC intermediate grade, ER 50%, PR negative HER-2 negative, Ki-67 40%, T1b N0 stage Ia   05/06/2018 Genetic Testing   Negative genetic testing on the [facility] Breast Cancer STAT + Common Hereditary Cancers Panel. The STAT Breast cancer panel offered by [facility] includes sequencing and rearrangement analysis for the following 9 genes:  ATM, BRCA1, BRCA2, CDH1, CHEK2, PALB2, PTEN, STK11 and TP53.  The Common Hereditary Cancers Panel offered by [facility] includes sequencing and/or deletion duplication testing of the following 47 genes: APC, ATM, AXIN2, BARD1, BMPR1A, BRCA1, BRCA2, BRIP1, CDH1, CDKN2A (p14ARF), CDKN2A (p16INK4a), CKD4, CHEK2, CTNNA1, DICER1, EPCAM (Deletion/duplication testing only), GREM1 (promoter region deletion/duplication testing only), KIT, MEN1, MLH1, MSH2, MSH3, MSH6, MUTYH, NBN, NF1,  NHTL1, PALB2, PDGFRA, PMS2, POLD1, POLE, PTEN, RAD50, RAD51C, RAD51D, SDHB, SDHC, SDHD, SMAD4, SMARCA4. STK11, TP53, TSC1, TSC2, and VHL.  The following genes were evaluated for sequence changes only: SDHA and HOXB13 c.251G>A variant only.  The report date is 05/06/18   05/13/2018 Surgery   Right lumpectomy: IDC with DCIS, 1.2 cm, focally involves lateral margin, 0/4 lymph nodes negative, ER 50%, PR 0%, HER-2 negative, Ki-67 40%, T1 CN 0 stage Ia   05/27/2018 Cancer Staging   Staging form: Breast, AJCC 8th Edition - Pathologic: Stage IA (pT1c, pN0, cM0, G2, ER+, PR-, HER2-) - Signed by Gardenia Phlegm, NP on 05/27/2018   06/11/2018 Surgery   Right breast reexcision of lateral margin: DCIS no invasive cancer, 0.1 cm from final lateral margin.   06/11/2018 Oncotype testing   Oncotype DX 31: 19% risk of recurrence, patient refused chemotherapy   07/13/2018 - 08/20/2018 Radiation Therapy   Adjuvant radiation therapy at [facility]   08/24/2018 -  Anti-estrogen oral therapy   Tamoxifen 20 mg daily (patient's last menstrual cycle July 2019); switched to anastrozole 07/20/19 due to hot flashes     Observations/Objective:     Assessment Plan:  Malignant neoplasm of upper-outer quadrant of right breast in female, estrogen receptor positive ([place]) 05/13/2018:Right lumpectomy: IDC with DCIS, 1.2 cm, focally involves lateral margin, 0/4 lymph nodes negative, ER 50%, PR 0%, HER-2 negative, Ki-67 40%, T1 CN 0 stage Ia Oncotype DX recurrence score 31: 19% risk of distant recurrence of 9 years with hormone therapy alone Chemo benefit greater than 15% 06/11/2018: Reexcision for lateral margin: DCIS  Recommendation: 1.Patient refused chemotherapy  2. Adjuvant radiation therapycompleted 08/19/2018 3. Followed by adjuvant antiestrogen therapystarted 08/24/2018 ---------------------------------------------------------------- Current treatment:Adjuvant antiestrogen therapy with tamoxifen 20 mg daily,  switched  to anastrozole 07/20/2019 because of severe hot flashes  Anastrozole toxicities:  No hot flashes. She is very excited that she does not have any hot flashes on anastrozole therapy. She denies any arthralgias or myalgias.  Breast cancer surveillance: 1.  Breast exam annually 2.  Mammogram 03/25/2019:[facility]: Benign  Return to clinic in 1 year for follow-up  I discussed the assessment and treatment plan with the patient. The patient was provided an opportunity to ask questions and all were answered. The patient agreed with the plan and demonstrated an understanding of the instructions. The patient was advised to call back or seek an in-person evaluation if the symptoms worsen or if the condition fails to improve as anticipated.   I provided 20 minutes of non-face-to-face time during this encounter.   Vinay K Gudena, MD 10/20/2019    I, Molly Dorshimer, am acting as scribe for Vinay Gudena, MD.  I have reviewed the above documentation for accuracy and completeness, and I agree with the above.    

## 2019-10-20 ENCOUNTER — Inpatient Hospital Stay: Payer: BC Managed Care – PPO | Attending: Adult Health | Admitting: Hematology and Oncology

## 2019-10-20 DIAGNOSIS — Z170 Estrogen receptor positive status [ER+]: Secondary | ICD-10-CM

## 2019-10-20 DIAGNOSIS — C50411 Malignant neoplasm of upper-outer quadrant of right female breast: Secondary | ICD-10-CM

## 2019-10-20 NOTE — Assessment & Plan Note (Signed)
05/13/2018:Right lumpectomy: IDC with DCIS, 1.2 cm, focally involves lateral margin, 0/4 lymph nodes negative, ER 50%, PR 0%, HER-2 negative, Ki-67 40%, T1 CN 0 stage Ia Oncotype DX recurrence score 31: 19% risk of distant recurrence of 9 years with hormone therapy alone Chemo benefit greater than 15% 06/11/2018: Reexcision for lateral margin: DCIS  Recommendation: 1.Patient refused chemotherapy  2. Adjuvant radiation therapycompleted 08/19/2018 3. Followed by adjuvant antiestrogen therapystarted 08/24/2018 ---------------------------------------------------------------- Current treatment:Adjuvant antiestrogen therapy with tamoxifen 20 mg daily, switched to anastrozole 07/20/2019 because of severe hot flashes  Anastrozole toxicities:   Breast cancer surveillance: 1.  Breast exam annually 2.  Mammogram 03/25/2019:[facility]: Benign Return to clinic in 1 year for follow-up

## 2020-07-18 NOTE — Progress Notes (Signed)
 Patient Care Team: Winfield, Albert Carl, MD as PCP - General (Family Medicine) Gudena, Vinay, MD as Consulting Physician (Hematology and Oncology) Yanagihara, Theodore, MD as Referring Physician (Radiation Oncology) Byerly, Faera, MD as Consulting Physician (General Surgery)  DIAGNOSIS:    ICD-10-CM   1. Malignant neoplasm of upper-outer quadrant of right breast in female, estrogen receptor positive ([facility])  C50.411    Z17.0     SUMMARY OF ONCOLOGIC HISTORY: Oncology History  Malignant neoplasm of upper-outer quadrant of right breast in female, estrogen receptor positive ([facility])  04/08/2018 Initial Diagnosis   Screening detected right breast mass at 9:30 position measuring 9 mm, biopsy revealed IDC intermediate grade, ER 50%, PR negative HER-2 negative, Ki-67 40%, T1b N0 stage Ia   05/06/2018 Genetic Testing   Negative genetic testing on the [facility] Breast Cancer STAT + Common Hereditary Cancers Panel. The STAT Breast cancer panel offered by [facility] includes sequencing and rearrangement analysis for the following 9 genes:  ATM, BRCA1, BRCA2, CDH1, CHEK2, PALB2, PTEN, STK11 and TP53.  The Common Hereditary Cancers Panel offered by [facility] includes sequencing and/or deletion duplication testing of the following 47 genes: APC, ATM, AXIN2, BARD1, BMPR1A, BRCA1, BRCA2, BRIP1, CDH1, CDKN2A (p14ARF), CDKN2A (p16INK4a), CKD4, CHEK2, CTNNA1, DICER1, EPCAM (Deletion/duplication testing only), GREM1 (promoter region deletion/duplication testing only), KIT, MEN1, MLH1, MSH2, MSH3, MSH6, MUTYH, NBN, NF1, NHTL1, PALB2, PDGFRA, PMS2, POLD1, POLE, PTEN, RAD50, RAD51C, RAD51D, SDHB, SDHC, SDHD, SMAD4, SMARCA4. STK11, TP53, TSC1, TSC2, and VHL.  The following genes were evaluated for sequence changes only: SDHA and HOXB13 c.251G>A variant only.  The report date is 05/06/18   05/13/2018 Surgery   Right lumpectomy: IDC with DCIS, 1.2 cm, focally involves lateral margin, 0/4 lymph nodes negative, ER 50%, PR 0%,  HER-2 negative, Ki-67 40%, T1 CN 0 stage Ia   05/27/2018 Cancer Staging   Staging form: Breast, AJCC 8th Edition - Pathologic: Stage IA (pT1c, pN0, cM0, G2, ER+, PR-, HER2-) - Signed by Causey, Lindsey Cornetto, NP on 05/27/2018   06/11/2018 Surgery   Right breast reexcision of lateral margin: DCIS no invasive cancer, 0.1 cm from final lateral margin.   06/11/2018 Oncotype testing   Oncotype DX 31: 19% risk of recurrence, patient refused chemotherapy   07/13/2018 - 08/20/2018 Radiation Therapy   Adjuvant radiation therapy at [facility]   08/24/2018 -  Anti-estrogen oral therapy   Tamoxifen 20 mg daily (patient's last menstrual cycle July 2019); switched to anastrozole 07/20/19 due to hot flashes     CHIEF COMPLIANT: Follow-up of right breast cancer on anastrozole   INTERVAL HISTORY: Donna Gould is a 59 y.o. with above-mentioned history of right breast cancer treated with lumpectomy, refused chemotherapy, underwent radiation, and is currently on anastrozole, after she had significant hot flashes on tamoxifen.She presents to the clinic todayfor follow-up.   ALLERGIES:  has No Known Allergies.  MEDICATIONS:  Current Outpatient Medications  Medication Sig Dispense Refill  . anastrozole (ARIMIDEX) 1 MG tablet Take 1 tablet (1 mg total) by mouth daily. 90 tablet 3  . lisinopril-hydrochlorothiazide (PRINZIDE,ZESTORETIC) 20-25 MG tablet Take 1 tablet by mouth every morning.  3   No current facility-administered medications for this visit.    PHYSICAL EXAMINATION: ECOG PERFORMANCE STATUS: 1 - Symptomatic but completely ambulatory  Vitals:   07/19/20 0856  BP: 124/75  Pulse: 80  Resp: 18  Temp: 98 F (36.7 C)  SpO2: 98%   Filed Weights   07/19/20 0856  Weight: 241 lb 4.8 oz (109.5 kg)      BREAST: No palpable masses or nodules in either right or left breasts. No palpable axillary supraclavicular or infraclavicular adenopathy no breast tenderness or nipple discharge. (exam performed  in the presence of a chaperone)  LABORATORY DATA:  I have reviewed the data as listed CMP Latest Ref Rng & Units 06/09/2018 05/12/2018  Glucose 70 - 99 mg/dL 103(H) 122(H)  BUN 6 - 20 mg/dL 16 10  Creatinine 0.44 - 1.00 mg/dL 1.08(H) 1.17(H)  Sodium 135 - 145 mmol/L 144 139  Potassium 3.5 - 5.1 mmol/L 3.4(L) 3.7  Chloride 98 - 111 mmol/L 102 102  CO2 22 - 32 mmol/L 33(H) 27  Calcium 8.9 - 10.3 mg/dL 9.2 8.8(L)  Total Protein 6.5 - 8.1 g/dL - 7.8  Total Bilirubin 0.3 - 1.2 mg/dL - 1.0  Alkaline Phos 38 - 126 U/L - 45  AST 15 - 41 U/L - 20  ALT 0 - 44 U/L - 16    Lab Results  Component Value Date   WBC 4.8 06/09/2018   HGB 12.0 06/09/2018   HCT 36.2 06/09/2018   MCV 79.6 06/09/2018   PLT 313 06/09/2018   NEUTROABS 2.1 05/12/2018    ASSESSMENT & PLAN:  Malignant neoplasm of upper-outer quadrant of right breast in female, estrogen receptor positive ([facility]) 05/13/2018:Right lumpectomy: IDC with DCIS, 1.2 cm, focally involves lateral margin, 0/4 lymph nodes negative, ER 50%, PR 0%, HER-2 negative, Ki-67 40%, T1 CN 0 stage Ia Oncotype DX recurrence score 31: 19% risk of distant recurrence of 9 years with hormone therapy alone Chemo benefit greater than 15% 06/11/2018: Reexcision for lateral margin: DCIS  Recommendation: 1.Patient refused chemotherapy  2. Adjuvant radiation therapycompleted 08/19/2018 3. Followed by adjuvant antiestrogen therapystarted 08/24/2018 ---------------------------------------------------------------- Current treatment:Adjuvant antiestrogen therapy with tamoxifen 20 mg daily,switched to anastrozole 07/20/2019 because of severe hot flashes.  It appears that the patient has not started anastrozole at all.  She is anxious about side effects and stomach upset.  I discussed with her that she has a high risk of breast cancer recurrence and that she should reconsider taking antiestrogen therapy.  She said that she will try to take half a tablet a day and see if  she tolerates it well and then increase to full tablet daily.  Breast cancer surveillance: 1.Breast exam 07/19/2020: Benign 2.Mammogram 03/25/2019: [place] [facility]: Benign  We will try to get a copy of her mammogram from 2021.  Return to clinic in 1 year for follow-up    No orders of the defined types were placed in this encounter.  The patient has a good understanding of the overall plan. she agrees with it. she will call with any problems that may develop before the next visit here.  Total time spent: 20 mins including face to face time and time spent for planning, charting and coordination of care  Gudena, Vinay, MD 07/19/2020  I, Molly Dorshimer, am acting as scribe for Dr. Vinay Gudena.  I have reviewed the above documentation for accuracy and completeness, and I agree with the above.       

## 2020-07-19 ENCOUNTER — Inpatient Hospital Stay: Payer: BC Managed Care – PPO | Attending: Hematology and Oncology | Admitting: Hematology and Oncology

## 2020-07-19 ENCOUNTER — Other Ambulatory Visit: Payer: Self-pay

## 2020-07-19 DIAGNOSIS — C50411 Malignant neoplasm of upper-outer quadrant of right female breast: Secondary | ICD-10-CM | POA: Diagnosis present

## 2020-07-19 DIAGNOSIS — Z170 Estrogen receptor positive status [ER+]: Secondary | ICD-10-CM | POA: Diagnosis not present

## 2020-07-19 DIAGNOSIS — Z923 Personal history of irradiation: Secondary | ICD-10-CM | POA: Insufficient documentation

## 2020-07-19 DIAGNOSIS — Z79811 Long term (current) use of aromatase inhibitors: Secondary | ICD-10-CM | POA: Diagnosis not present

## 2020-07-19 NOTE — Assessment & Plan Note (Signed)
05/13/2018:Right lumpectomy: IDC with DCIS, 1.2 cm, focally involves lateral margin, 0/4 lymph nodes negative, ER 50%, PR 0%, HER-2 negative, Ki-67 40%, T1 CN 0 stage Ia Oncotype DX recurrence score 31: 19% risk of distant recurrence of 9 years with hormone therapy alone Chemo benefit greater than 15% 06/11/2018: Reexcision for lateral margin: DCIS  Recommendation: 1.Patient refused chemotherapy  2. Adjuvant radiation therapycompleted 08/19/2018 3. Followed by adjuvant antiestrogen therapystarted 08/24/2018 ---------------------------------------------------------------- Current treatment:Adjuvant antiestrogen therapy with tamoxifen 20 mg daily,switched to anastrozole 07/20/2019 because of severe hot flashes  Anastrozole toxicities:  No hot flashes. She denies any arthralgias or myalgias.  Breast cancer surveillance: 1.Breast exam 07/19/2020: Benign 2.[facility]/2019[place]: Benign  Return to clinic in 1 year for follow-up 

## 2021-07-17 NOTE — Progress Notes (Signed)
Patient Care Team: Andres Shad, MD as PCP - General (Family Medicine) Nicholas Lose, MD as Consulting Physician (Hematology and Oncology) Meda Klinefelter, MD as Referring Physician (Radiation Oncology) [name], MD as Consulting Physician (General Surgery)  DIAGNOSIS:    ICD-10-CM   1. Malignant neoplasm of upper-outer quadrant of right breast in female, estrogen receptor positive ([place])  C50.411    Z17.0       SUMMARY OF ONCOLOGIC HISTORY: Oncology History  Malignant neoplasm of upper-outer quadrant of right breast in female, estrogen receptor positive ([place])  04/08/2018 Initial Diagnosis   Screening detected right breast mass at 9:30 position measuring 9 mm, biopsy revealed IDC intermediate grade, ER 50%, PR negative HER-2 negative, Ki-67 40%, T1b N0 stage Ia   05/06/2018 Genetic Testing   Negative genetic testing on the [facility] Breast Cancer STAT + Common Hereditary Cancers Panel. The STAT Breast cancer panel offered by [facility] includes sequencing and rearrangement analysis for the following 9 genes:  ATM, BRCA1, BRCA2, CDH1, CHEK2, PALB2, PTEN, STK11 and TP53.  The Common Hereditary Cancers Panel offered by [facility] includes sequencing and/or deletion duplication testing of the following 47 genes: APC, ATM, AXIN2, BARD1, BMPR1A, BRCA1, BRCA2, BRIP1, CDH1, CDKN2A (p14ARF), CDKN2A (p16INK4a), CKD4, CHEK2, CTNNA1, DICER1, EPCAM (Deletion/duplication testing only), GREM1 (promoter region deletion/duplication testing only), KIT, MEN1, MLH1, MSH2, MSH3, MSH6, MUTYH, NBN, NF1, NHTL1, PALB2, PDGFRA, PMS2, POLD1, POLE, PTEN, RAD50, RAD51C, RAD51D, SDHB, SDHC, SDHD, SMAD4, SMARCA4. STK11, TP53, TSC1, TSC2, and VHL.  The following genes were evaluated for sequence changes only: SDHA and HOXB13 c.251G>A variant only.  The report date is 05/06/18   05/13/2018 Surgery   Right lumpectomy: IDC with DCIS, 1.2 cm, focally involves lateral margin, 0/4 lymph nodes negative, ER 50%, PR 0%,  HER-2 negative, Ki-67 40%, T1 CN 0 stage Ia   05/27/2018 Cancer Staging   Staging form: Breast, AJCC 8th Edition - Pathologic: Stage IA (pT1c, pN0, cM0, G2, ER+, PR-, HER2-) - Signed by Gardenia Phlegm, NP on 05/27/2018    06/11/2018 Surgery   Right breast reexcision of lateral margin: DCIS no invasive cancer, 0.1 cm from final lateral margin.   06/11/2018 Oncotype testing   Oncotype DX 31: 19% risk of recurrence, patient refused chemotherapy   07/13/2018 - 08/20/2018 Radiation Therapy   Adjuvant radiation therapy at [facility]   08/24/2018 -  Anti-estrogen oral therapy   Tamoxifen 20 mg daily (patient's last menstrual cycle July 2019); switched to anastrozole 07/20/19 due to hot flashes     CHIEF COMPLIANT: Follow-up of right breast cancer    INTERVAL HISTORY: Donna Gould is a 60 y.o. with above-mentioned history of right breast cancer treated with lumpectomy, refused chemotherapy, underwent radiation, and is currently on anastrozole, after she had significant hot flashes on tamoxifen. She presents to the clinic today for follow-up.  She decided not to take antiestrogen therapy.  We prescribed her anastrozole but she did not start it.  She understands risks and benefits and feels that she will not be able to handle the antiestrogen treatment.  ALLERGIES:  has No Known Allergies.  MEDICATIONS:  Current Outpatient Medications  Medication Sig Dispense Refill   anastrozole (ARIMIDEX) 1 MG tablet Take 1 tablet (1 mg total) by mouth daily. 90 tablet 3   lisinopril-hydrochlorothiazide (PRINZIDE,ZESTORETIC) 20-25 MG tablet Take 1 tablet by mouth every morning.  3   No current facility-administered medications for this visit.    PHYSICAL EXAMINATION: ECOG PERFORMANCE STATUS: 1 - Symptomatic but completely ambulatory  Vitals:   07/18/21 0848  BP: 107/74  Pulse: 70  Resp: 16  Temp: 97.7 F (36.5 C)  SpO2: 100%   Filed Weights   07/18/21 0848  Weight: 242 lb (109.8 kg)     BREAST: No palpable masses or nodules in either right or left breasts. No palpable axillary supraclavicular or infraclavicular adenopathy no breast tenderness or nipple discharge. (exam performed in the presence of a chaperone)  LABORATORY DATA:  I have reviewed the data as listed CMP Latest Ref Rng & Units 06/09/2018 05/12/2018  Glucose 70 - 99 mg/dL 103(H) 122(H)  BUN 6 - 20 mg/dL 16 10  Creatinine 0.44 - 1.00 mg/dL 1.08(H) 1.17(H)  Sodium 135 - 145 mmol/L 144 139  Potassium 3.5 - 5.1 mmol/L 3.4(L) 3.7  Chloride 98 - 111 mmol/L 102 102  CO2 22 - 32 mmol/L 33(H) 27  Calcium 8.9 - 10.3 mg/dL 9.2 8.8(L)  Total Protein 6.5 - 8.1 g/dL - 7.8  Total Bilirubin 0.3 - 1.2 mg/dL - 1.0  Alkaline Phos 38 - 126 U/L - 45  AST 15 - 41 U/L - 20  ALT 0 - 44 U/L - 16    Lab Results  Component Value Date   WBC 4.8 06/09/2018   HGB 12.0 06/09/2018   HCT 36.2 06/09/2018   MCV 79.6 06/09/2018   PLT 313 06/09/2018   NEUTROABS 2.1 05/12/2018    ASSESSMENT & PLAN:  Malignant neoplasm of upper-outer quadrant of right breast in female, estrogen receptor positive ([place]) 05/13/2018:Right lumpectomy: IDC with DCIS, 1.2 cm, focally involves lateral margin, 0/4 lymph nodes negative, ER 50%, PR 0%, HER-2 negative, Ki-67 40%, T1 CN 0 stage Ia Oncotype DX recurrence score 31: 19% risk of distant recurrence of 9 years with hormone therapy alone Chemo benefit greater than 15% 06/11/2018: Reexcision for lateral margin: DCIS   Recommendation: 1. Patient refused chemotherapy  2. Adjuvant radiation therapy completed 08/19/2018 3. Followed by adjuvant antiestrogen therapy started 08/24/2018 ---------------------------------------------------------------- Current treatment: Adjuvant antiestrogen therapy with tamoxifen 20 mg daily, switched to anastrozole 07/20/2019 because of severe hot flashes.   She did not take anastrozole and decided not to take any further antiestrogen therapy   Breast cancer surveillance: 1.   Breast exam 07/17/2021: Benign 2.  Mammogram 04/12/2020: [place] [facility]: Benign  We will try to obtain the copy of her mammogram.   Return to clinic in 1 year for follow-up    No orders of the defined types were placed in this encounter.  The patient has a good understanding of the overall plan. she agrees with it. she will call with any problems that may develop before the next visit here.  Total time spent: 20 mins including face to face time and time spent for planning, charting and coordination of care  Rulon Eisenmenger, MD, MPH 07/18/2021  I, Thana Ates, am acting as scribe for Dr. Nicholas Lose.  I have reviewed the above documentation for accuracy and completeness, and I agree with the above.

## 2021-07-17 NOTE — Assessment & Plan Note (Signed)
05/13/2018:Right lumpectomy: IDC with DCIS, 1.2 cm, focally involves lateral margin, 0/4 lymph nodes negative, ER 50%, PR 0%, HER-2 negative, Ki-67 40%, T1 CN 0 stage Ia Oncotype DX recurrence score 31: 19% risk of distant recurrence of 9 years with hormone therapy alone Chemo benefit greater than 15% 06/11/2018: Reexcision for lateral margin: DCIS  Recommendation: 1.Patient refused chemotherapy  2. Adjuvant radiation therapycompleted 08/19/2018 3. Followed by adjuvant antiestrogen therapystarted 08/24/2018 ---------------------------------------------------------------- Current treatment:Adjuvant antiestrogen therapy with tamoxifen 20 mg daily,switched to anastrozole 11/3/2020because of severe hot flashes.  It appears that the patient has not started anastrozole at all.  She is anxious about side effects and stomach upset.  I discussed with her that she has a high risk of breast cancer recurrence and that she should reconsider taking antiestrogen therapy.  She said that she will try to take half a tablet a day and see if she tolerates it well and then increase to full tablet daily.  Breast cancer surveillance: 1.Breast exam 07/17/2021: Benign 2.Mammogram 04/12/2020: [facility] [facility]: Benign    Return to clinic in 1 year for follow-up

## 2021-07-18 ENCOUNTER — Inpatient Hospital Stay: Payer: BC Managed Care – PPO | Attending: Hematology and Oncology | Admitting: Hematology and Oncology

## 2021-07-18 ENCOUNTER — Other Ambulatory Visit: Payer: Self-pay

## 2021-07-18 DIAGNOSIS — Z79811 Long term (current) use of aromatase inhibitors: Secondary | ICD-10-CM | POA: Diagnosis not present

## 2021-07-18 DIAGNOSIS — C50411 Malignant neoplasm of upper-outer quadrant of right female breast: Secondary | ICD-10-CM | POA: Diagnosis present

## 2021-07-18 DIAGNOSIS — Z923 Personal history of irradiation: Secondary | ICD-10-CM | POA: Insufficient documentation

## 2021-07-18 DIAGNOSIS — Z170 Estrogen receptor positive status [ER+]: Secondary | ICD-10-CM | POA: Diagnosis not present

## 2022-07-14 NOTE — Progress Notes (Signed)
Patient Care Team: Andres Shad, MD as PCP - General (Family Medicine) Nicholas Lose, MD as Consulting Physician (Hematology and Oncology) Meda Klinefelter, MD as Referring Physician (Radiation Oncology) [name], MD as Consulting Physician (General Surgery)  DIAGNOSIS:  Encounter Diagnosis  Name Primary?   Malignant neoplasm of upper-outer quadrant of right breast in female, estrogen receptor positive ([place]) Yes    SUMMARY OF ONCOLOGIC HISTORY: Oncology History  Malignant neoplasm of upper-outer quadrant of right breast in female, estrogen receptor positive ([place])  04/08/2018 Initial Diagnosis   Screening detected right breast mass at 9:30 position measuring 9 mm, biopsy revealed IDC intermediate grade, ER 50%, PR negative HER-2 negative, Ki-67 40%, T1b N0 stage Ia   05/06/2018 Genetic Testing   Negative genetic testing on the [facility] Breast Cancer STAT + Common Hereditary Cancers Panel. The STAT Breast cancer panel offered by [facility] includes sequencing and rearrangement analysis for the following 9 genes:  ATM, BRCA1, BRCA2, CDH1, CHEK2, PALB2, PTEN, STK11 and TP53.  The Common Hereditary Cancers Panel offered by [facility] includes sequencing and/or deletion duplication testing of the following 47 genes: APC, ATM, AXIN2, BARD1, BMPR1A, BRCA1, BRCA2, BRIP1, CDH1, CDKN2A (p14ARF), CDKN2A (p16INK4a), CKD4, CHEK2, CTNNA1, DICER1, EPCAM (Deletion/duplication testing only), GREM1 (promoter region deletion/duplication testing only), KIT, MEN1, MLH1, MSH2, MSH3, MSH6, MUTYH, NBN, NF1, NHTL1, PALB2, PDGFRA, PMS2, POLD1, POLE, PTEN, RAD50, RAD51C, RAD51D, SDHB, SDHC, SDHD, SMAD4, SMARCA4. STK11, TP53, TSC1, TSC2, and VHL.  The following genes were evaluated for sequence changes only: SDHA and HOXB13 c.251G>A variant only.  The report date is 05/06/18   05/13/2018 Surgery   Right lumpectomy: IDC with DCIS, 1.2 cm, focally involves lateral margin, 0/4 lymph nodes negative, ER 50%, PR 0%,  HER-2 negative, Ki-67 40%, T1 CN 0 stage Ia   05/27/2018 Cancer Staging   Staging form: Breast, AJCC 8th Edition - Pathologic: Stage IA (pT1c, pN0, cM0, G2, ER+, PR-, HER2-) - Signed by Gardenia Phlegm, NP on 05/27/2018   06/11/2018 Surgery   Right breast reexcision of lateral margin: DCIS no invasive cancer, 0.1 cm from final lateral margin.   06/11/2018 Oncotype testing   Oncotype DX 31: 19% risk of recurrence, patient refused chemotherapy   07/13/2018 - 08/20/2018 Radiation Therapy   Adjuvant radiation therapy at [facility]   08/24/2018 - 06/2019 Anti-estrogen oral therapy   Tamoxifen 20 mg daily (patient's last menstrual cycle July 2019); switched to anastrozole 07/20/19 due to hot flashes but she did not take it     CHIEF COMPLIANT: Follow-up anastrozole therapy.  INTERVAL HISTORY: Donna Gould is a 61 y.o. with above-mentioned history of right breast cancer treated with lumpectomy, refused chemotherapy, underwent radiation, and is currently on anastrozole, after she had significant hot flashes on tamoxifen. She presents to the clinic today for follow-up.  She denies any lumps or nodules in the breast.  She has been exercising regularly and lost 15 pounds.   ALLERGIES:  has No Known Allergies.  MEDICATIONS:  Current Outpatient Medications  Medication Sig Dispense Refill   anastrozole (ARIMIDEX) 1 MG tablet Take 1 tablet (1 mg total) by mouth daily. 90 tablet 3   lisinopril-hydrochlorothiazide (PRINZIDE,ZESTORETIC) 20-25 MG tablet Take 1 tablet by mouth every morning.  3   No current facility-administered medications for this visit.    PHYSICAL EXAMINATION: ECOG PERFORMANCE STATUS: 1 - Symptomatic but completely ambulatory  Vitals:   07/19/22 0944  BP: (!) 145/85  Pulse: 78  Resp: 18  Temp: 97.8 F (36.6 C)  SpO2: 99%   Filed Weights   07/19/22 0944  Weight: 228 lb 8 oz (103.6 kg)    BREAST: No palpable masses or nodules in either right or left breasts. No  palpable axillary supraclavicular or infraclavicular adenopathy no breast tenderness or nipple discharge. (exam performed in the presence of a chaperone)  LABORATORY DATA:  I have reviewed the data as listed    Latest Ref Rng & Units 06/09/2018    2:46 PM 05/12/2018    3:05 PM  CMP  Glucose 70 - 99 mg/dL 103  122   BUN 6 - 20 mg/dL 16  10   Creatinine 0.44 - 1.00 mg/dL 1.08  1.17   Sodium 135 - 145 mmol/L 144  139   Potassium 3.5 - 5.1 mmol/L 3.4  3.7   Chloride 98 - 111 mmol/L 102  102   CO2 22 - 32 mmol/L 33  27   Calcium 8.9 - 10.3 mg/dL 9.2  8.8   Total Protein 6.5 - 8.1 g/dL  7.8   Total Bilirubin 0.3 - 1.2 mg/dL  1.0   Alkaline Phos 38 - 126 U/L  45   AST 15 - 41 U/L  20   ALT 0 - 44 U/L  16     Lab Results  Component Value Date   WBC 4.8 06/09/2018   HGB 12.0 06/09/2018   HCT 36.2 06/09/2018   MCV 79.6 06/09/2018   PLT 313 06/09/2018   NEUTROABS 2.1 05/12/2018    ASSESSMENT & PLAN:  Malignant neoplasm of upper-outer quadrant of right breast in female, estrogen receptor positive ([place]) 05/13/2018:Right lumpectomy: IDC with DCIS, 1.2 cm, focally involves lateral margin, 0/4 lymph nodes negative, ER 50%, PR 0%, HER-2 negative, Ki-67 40%, T1 CN 0 stage Ia Oncotype DX recurrence score 31: 19% risk of distant recurrence of 9 years with hormone therapy alone Chemo benefit greater than 15% 06/11/2018: Reexcision for lateral margin: DCIS   Recommendation: 1. Patient refused chemotherapy  2. Adjuvant radiation therapy completed 08/19/2018 3. Followed by adjuvant antiestrogen therapy started 08/24/2018 ---------------------------------------------------------------- Current treatment: Adjuvant antiestrogen therapy with tamoxifen 20 mg daily, switched to anastrozole 07/20/2019 because of severe hot flashes.   She did not take anastrozole and decided not to take any further antiestrogen therapy   Breast cancer surveillance: 1.  Breast exam 07/19/2022: Benign 2.  Mammogram  03/27/2021: [place] [facility]: Benign.  She had a new mammogram this year we will have to get the result  She lost 15 pounds by exercising regularly about 2 miles a day.  She enjoys buying shoes. Return to clinic in 1 year for follow-up and after that she could be seen on an as-needed basis.    No orders of the defined types were placed in this encounter.  The patient has a good understanding of the overall plan. she agrees with it. she will call with any problems that may develop before the next visit here. Total time spent: 30 mins including face to face time and time spent for planning, charting and co-ordination of care   Harriette Ohara, MD 07/19/22    I Gardiner Coins am scribing for Dr. Lindi Adie  I have reviewed the above documentation for accuracy and completeness, and I agree with the above.

## 2022-07-19 ENCOUNTER — Other Ambulatory Visit: Payer: Self-pay

## 2022-07-19 ENCOUNTER — Inpatient Hospital Stay: Payer: BC Managed Care – PPO | Attending: Hematology and Oncology | Admitting: Hematology and Oncology

## 2022-07-19 VITALS — BP 145/85 | HR 78 | Temp 97.8°F | Resp 18 | Ht 69.0 in | Wt 228.5 lb

## 2022-07-19 DIAGNOSIS — Z923 Personal history of irradiation: Secondary | ICD-10-CM | POA: Diagnosis not present

## 2022-07-19 DIAGNOSIS — Z170 Estrogen receptor positive status [ER+]: Secondary | ICD-10-CM | POA: Insufficient documentation

## 2022-07-19 DIAGNOSIS — C50411 Malignant neoplasm of upper-outer quadrant of right female breast: Secondary | ICD-10-CM | POA: Insufficient documentation

## 2022-07-19 DIAGNOSIS — Z79811 Long term (current) use of aromatase inhibitors: Secondary | ICD-10-CM | POA: Insufficient documentation

## 2022-07-19 DIAGNOSIS — Z79899 Other long term (current) drug therapy: Secondary | ICD-10-CM | POA: Insufficient documentation

## 2022-07-19 NOTE — Assessment & Plan Note (Addendum)
05/13/2018:Right lumpectomy: IDC with DCIS, 1.2 cm, focally involves lateral margin, 0/4 lymph nodes negative, ER 50%, PR 0%, HER-2 negative, Ki-67 40%, T1 CN 0 stage Ia Oncotype DX recurrence score 31: 19% risk of distant recurrence of 9 years with hormone therapy alone Chemo benefit greater than 15% 06/11/2018: Reexcision for lateral margin: DCIS   Recommendation: 1. Patient refused chemotherapy  2. Adjuvant radiation therapy completed 08/19/2018 3. Followed by adjuvant antiestrogen therapy started 08/24/2018 ---------------------------------------------------------------- Current treatment: Adjuvant antiestrogen therapy with tamoxifen 20 mg daily, switched to anastrozole 07/20/2019 because of severe hot flashes.   She did not take anastrozole and decided not to take any further antiestrogen therapy   Breast cancer surveillance: 1.  Breast exam 07/19/2022: Benign 2.  Mammogram 03/27/2021: [place] [facility]: Benign.  She had a new mammogram this year we will have to get the result    Return to clinic in 1 year for follow-up and after that she could be seen on an as-needed basis.

## 2023-07-24 NOTE — Assessment & Plan Note (Addendum)
05/13/2018:Right lumpectomy: IDC with DCIS, 1.2 cm, focally involves lateral margin, 0/4 lymph nodes negative, ER 50%, PR 0%, HER-2 negative, Ki-67 40%, T1 CN 0 stage Ia Oncotype DX recurrence score 31: 19% risk of distant recurrence of 9 years with hormone therapy alone Chemo benefit greater than 15% 06/11/2018: Reexcision for lateral margin: DCIS   Recommendation: 1. Patient refused chemotherapy  2. Adjuvant radiation therapy completed 08/19/2018 3. Followed by adjuvant antiestrogen therapy started 08/24/2018 discontinued December 2020 because of toxicities ---------------------------------------------------------------- Current treatment: Surveillance Breast cancer surveillance: 1.  Breast exam 07/25/2023: Benign 2.  Mammogram 06/10/2023: [place] [facility]: Benign.     She gained weight mainly because her mother passed away recently and she was taking care of her.  She is planning to get back into walking and hiking.   Return to clinic  on an as-needed basis.

## 2023-07-25 ENCOUNTER — Inpatient Hospital Stay: Payer: BC Managed Care – PPO | Attending: Hematology and Oncology | Admitting: Hematology and Oncology

## 2023-07-25 VITALS — BP 139/71 | HR 62 | Temp 97.3°F | Resp 18 | Ht 69.0 in | Wt 243.6 lb

## 2023-07-25 DIAGNOSIS — Z923 Personal history of irradiation: Secondary | ICD-10-CM | POA: Diagnosis not present

## 2023-07-25 DIAGNOSIS — Z79811 Long term (current) use of aromatase inhibitors: Secondary | ICD-10-CM | POA: Insufficient documentation

## 2023-07-25 DIAGNOSIS — C50411 Malignant neoplasm of upper-outer quadrant of right female breast: Secondary | ICD-10-CM | POA: Diagnosis present

## 2023-07-25 DIAGNOSIS — Z170 Estrogen receptor positive status [ER+]: Secondary | ICD-10-CM | POA: Diagnosis not present

## 2023-07-25 NOTE — Progress Notes (Signed)
Patient Care Team: Arlina Robes, MD as PCP - General (Family Medicine) Serena Croissant, MD as Consulting Physician (Hematology and Oncology) Stanford Breed, MD as Referring Physician (Radiation Oncology) Almond Lint, MD as Consulting Physician (General Surgery)  DIAGNOSIS:  Encounter Diagnosis  Name Primary?   Malignant neoplasm of upper-outer quadrant of right breast in female, estrogen receptor positive ([facility]) Yes    SUMMARY OF ONCOLOGIC HISTORY: Oncology History  Malignant neoplasm of upper-outer quadrant of right breast in female, estrogen receptor positive ([facility])  04/08/2018 Initial Diagnosis   Screening detected right breast mass at 9:30 position measuring 9 mm, biopsy revealed IDC intermediate grade, ER 50%, PR negative HER-2 negative, Ki-67 40%, T1b N0 stage Ia   05/06/2018 Genetic Testing   Negative genetic testing on the [facility] Breast Cancer STAT + Common Hereditary Cancers Panel. The STAT Breast cancer panel offered by [facility] includes sequencing and rearrangement analysis for the following 9 genes:  ATM, BRCA1, BRCA2, CDH1, CHEK2, PALB2, PTEN, STK11 and TP53.  The Common Hereditary Cancers Panel offered by [facility] includes sequencing and/or deletion duplication testing of the following 47 genes: APC, ATM, AXIN2, BARD1, BMPR1A, BRCA1, BRCA2, BRIP1, CDH1, CDKN2A (p14ARF), CDKN2A (p16INK4a), CKD4, CHEK2, CTNNA1, DICER1, EPCAM (Deletion/duplication testing only), GREM1 (promoter region deletion/duplication testing only), KIT, MEN1, MLH1, MSH2, MSH3, MSH6, MUTYH, NBN, NF1, NHTL1, PALB2, PDGFRA, PMS2, POLD1, POLE, PTEN, RAD50, RAD51C, RAD51D, SDHB, SDHC, SDHD, SMAD4, SMARCA4. STK11, TP53, TSC1, TSC2, and VHL.  The following genes were evaluated for sequence changes only: SDHA and HOXB13 c.251G>A variant only.  The report date is 05/06/18   05/13/2018 Surgery   Right lumpectomy: IDC with DCIS, 1.2 cm, focally involves lateral margin, 0/4 lymph nodes negative, ER 50%, PR 0%,  HER-2 negative, Ki-67 40%, T1 CN 0 stage Ia   05/27/2018 Cancer Staging   Staging form: Breast, AJCC 8th Edition - Pathologic: Stage IA (pT1c, pN0, cM0, G2, ER+, PR-, HER2-) - Signed by Loa Socks, NP on 05/27/2018   06/11/2018 Surgery   Right breast reexcision of lateral margin: DCIS no invasive cancer, 0.1 cm from final lateral margin.   06/11/2018 Oncotype testing   Oncotype DX 31: 19% risk of recurrence, patient refused chemotherapy   07/13/2018 - 08/20/2018 Radiation Therapy   Adjuvant radiation therapy at [facility]   08/24/2018 - 06/2019 Anti-estrogen oral therapy   Tamoxifen 20 mg daily (patient's last menstrual cycle July 2019); switched to anastrozole 07/20/19 due to hot flashes but she did not take it     CHIEF COMPLIANT: Surveillance of breast cancer  HISTORY OF PRESENT ILLNESS:   History of Present Illness   The patient, a breast cancer survivor, presents for an annual follow-up. She had a recent mammogram at [facility], which showed no suspicious findings and a breast density on the lower side (likely category B). She reports no new health issues over the past year.  The patient had previously lost 15 pounds but regained 12 pounds due to stress and changes in eating habits following the passing of her mother. She is not currently on any hormone therapies due to previous side effects.  The patient is interested in hiking as a form of exercise and has been working on incorporating hills into her walking routine. She expresses a desire to lose weight and improve her health.         ALLERGIES:  has No Known Allergies.  MEDICATIONS:  Current Outpatient Medications  Medication Sig Dispense Refill   anastrozole (ARIMIDEX) 1 MG tablet Take  1 tablet (1 mg total) by mouth daily. 90 tablet 3   lisinopril-hydrochlorothiazide (PRINZIDE,ZESTORETIC) 20-25 MG tablet Take 1 tablet by mouth every morning.  3   No current facility-administered medications for this visit.     PHYSICAL EXAMINATION: ECOG PERFORMANCE STATUS: 1 - Symptomatic but completely ambulatory  Vitals:   07/25/23 0947  BP: 139/71  Pulse: 62  Resp: 18  Temp: (!) 97.3 F (36.3 C)  SpO2: 100%   Filed Weights   07/25/23 0947  Weight: 243 lb 9.6 oz (110.5 kg)      LABORATORY DATA:  I have reviewed the data as listed    Latest Ref Rng & Units 06/09/2018    2:46 PM 05/12/2018    3:05 PM  CMP  Glucose 70 - 99 mg/dL 981  191   BUN 6 - 20 mg/dL 16  10   Creatinine 4.78 - 1.00 mg/dL 2.95  6.21   Sodium 308 - 145 mmol/L 144  139   Potassium 3.5 - 5.1 mmol/L 3.4  3.7   Chloride 98 - 111 mmol/L 102  102   CO2 22 - 32 mmol/L 33  27   Calcium 8.9 - 10.3 mg/dL 9.2  8.8   Total Protein 6.5 - 8.1 g/dL  7.8   Total Bilirubin 0.3 - 1.2 mg/dL  1.0   Alkaline Phos 38 - 126 U/L  45   AST 15 - 41 U/L  20   ALT 0 - 44 U/L  16     Lab Results  Component Value Date   WBC 4.8 06/09/2018   HGB 12.0 06/09/2018   HCT 36.2 06/09/2018   MCV 79.6 06/09/2018   PLT 313 06/09/2018   NEUTROABS 2.1 05/12/2018    ASSESSMENT & PLAN:  Malignant neoplasm of upper-outer quadrant of right breast in female, estrogen receptor positive ([facility]) 05/13/2018:Right lumpectomy: IDC with DCIS, 1.2 cm, focally involves lateral margin, 0/4 lymph nodes negative, ER 50%, PR 0%, HER-2 negative, Ki-67 40%, T1 CN 0 stage Ia Oncotype DX recurrence score 31: 19% risk of distant recurrence of 9 years with hormone therapy alone Chemo benefit greater than 15% 06/11/2018: Reexcision for lateral margin: DCIS   Recommendation: 1. Patient refused chemotherapy  2. Adjuvant radiation therapy completed 08/19/2018 3. Followed by adjuvant antiestrogen therapy started 08/24/2018 discontinued December 2020 because of toxicities ---------------------------------------------------------------- Current treatment: Surveillance Breast cancer surveillance: 1.  Mammogram 06/10/2023: [place] [facility]: Benign.     She gained weight  mainly because her mother passed away recently and she was taking care of her.  She is planning to get back into walking and hiking.   Return to clinic  on an as-needed basis.    No orders of the defined types were placed in this encounter.  The patient has a good understanding of the overall plan. she agrees with it. she will call with any problems that may develop before the next visit here. Total time spent: 30 mins including face to face time and time spent for planning, charting and co-ordination of care   Tamsen Meek, MD 07/25/23

## 2023-07-29 ENCOUNTER — Encounter: Payer: Self-pay | Admitting: Family Medicine
